# Patient Record
Sex: Male | Born: 1995
Health system: Southern US, Community
[De-identification: ages and names within clinical notes are randomized; demographics above are authoritative.]

## PROBLEM LIST (undated history)

## (undated) ENCOUNTER — Emergency Department (HOSPITAL_COMMUNITY): Payer: Medicaid Other

## (undated) ENCOUNTER — Emergency Department (HOSPITAL_COMMUNITY): Payer: Medicaid Other | Source: Home / Self Care

## (undated) DIAGNOSIS — J45909 Unspecified asthma, uncomplicated: Secondary | ICD-10-CM

## (undated) DIAGNOSIS — Z4802 Encounter for removal of sutures: Secondary | ICD-10-CM

## (undated) DIAGNOSIS — T148XXA Other injury of unspecified body region, initial encounter: Secondary | ICD-10-CM

## (undated) DIAGNOSIS — F1910 Other psychoactive substance abuse, uncomplicated: Secondary | ICD-10-CM

## (undated) DIAGNOSIS — T07XXXA Unspecified multiple injuries, initial encounter: Secondary | ICD-10-CM

## (undated) DIAGNOSIS — Z4800 Encounter for change or removal of nonsurgical wound dressing: Secondary | ICD-10-CM

## (undated) HISTORY — PX: TYMPANOSTOMY TUBE PLACEMENT: SHX32

---
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
## 2014-08-27 ENCOUNTER — Emergency Department (HOSPITAL_BASED_OUTPATIENT_CLINIC_OR_DEPARTMENT_OTHER)
Admission: EM | Admit: 2014-08-27 | Discharge: 2014-08-27 | Disposition: A | Discharge status: Home / Self Care | Payer: Self-pay | Attending: Emergency Medicine | Admitting: Emergency Medicine

## 2014-08-27 ENCOUNTER — Encounter (HOSPITAL_BASED_OUTPATIENT_CLINIC_OR_DEPARTMENT_OTHER): Payer: Self-pay | Admitting: Emergency Medicine

## 2014-08-27 ENCOUNTER — Emergency Department (HOSPITAL_BASED_OUTPATIENT_CLINIC_OR_DEPARTMENT_OTHER): Payer: BC Managed Care – PPO

## 2014-08-27 DIAGNOSIS — J45901 Unspecified asthma with (acute) exacerbation: Secondary | ICD-10-CM | POA: Insufficient documentation

## 2014-08-27 DIAGNOSIS — R05 Cough: Secondary | ICD-10-CM

## 2014-08-27 DIAGNOSIS — Z79899 Other long term (current) drug therapy: Secondary | ICD-10-CM | POA: Insufficient documentation

## 2014-08-27 DIAGNOSIS — T162XXA Foreign body in left ear, initial encounter: Secondary | ICD-10-CM | POA: Insufficient documentation

## 2014-08-27 DIAGNOSIS — Z720 Tobacco use: Secondary | ICD-10-CM | POA: Insufficient documentation

## 2014-08-27 DIAGNOSIS — X58XXXA Exposure to other specified factors, initial encounter: Secondary | ICD-10-CM | POA: Insufficient documentation

## 2014-08-27 DIAGNOSIS — Y9289 Other specified places as the place of occurrence of the external cause: Secondary | ICD-10-CM | POA: Insufficient documentation

## 2014-08-27 DIAGNOSIS — Y998 Other external cause status: Secondary | ICD-10-CM | POA: Insufficient documentation

## 2014-08-27 DIAGNOSIS — J069 Acute upper respiratory infection, unspecified: Secondary | ICD-10-CM | POA: Insufficient documentation

## 2014-08-27 DIAGNOSIS — R059 Cough, unspecified: Secondary | ICD-10-CM

## 2014-08-27 DIAGNOSIS — Y9389 Activity, other specified: Secondary | ICD-10-CM | POA: Insufficient documentation

## 2014-08-27 MED ORDER — AZITHROMYCIN 250 MG PO TABS
500.0000 mg | ORAL_TABLET | Freq: Once | ORAL | Status: AC
  Administered 2014-08-27: 500 mg via ORAL
  Filled 2014-08-27: qty 2

## 2014-08-27 MED ORDER — ALBUTEROL SULFATE HFA 108 (90 BASE) MCG/ACT IN AERS
2.0000 | INHALATION_SPRAY | Freq: Once | RESPIRATORY_TRACT | Status: AC
  Administered 2014-08-27: 2 via RESPIRATORY_TRACT
  Filled 2014-08-27: qty 6.7

## 2014-08-27 MED ORDER — AZITHROMYCIN 250 MG PO TABS: 250.0000 mg | ORAL_TABLET | Freq: Every day | ORAL | Status: DC

## 2014-08-27 MED ORDER — PREDNISONE 20 MG PO TABS: ORAL_TABLET | ORAL | Status: DC

## 2014-08-27 MED ORDER — PREDNISONE 50 MG PO TABS
60.0000 mg | ORAL_TABLET | Freq: Once | ORAL | Status: AC
  Administered 2014-08-27: 60 mg via ORAL
  Filled 2014-08-27 (×2): qty 1

## 2014-08-27 MED ORDER — ALBUTEROL SULFATE (2.5 MG/3ML) 0.083% IN NEBU
2.5000 mg | INHALATION_SOLUTION | Freq: Once | RESPIRATORY_TRACT | Status: AC
  Administered 2014-08-27: 2.5 mg via RESPIRATORY_TRACT
  Filled 2014-08-27: qty 3

## 2014-08-27 MED ORDER — IPRATROPIUM-ALBUTEROL 0.5-2.5 (3) MG/3ML IN SOLN
3.0000 mL | Freq: Once | RESPIRATORY_TRACT | Status: AC
  Administered 2014-08-27: 3 mL via RESPIRATORY_TRACT
  Filled 2014-08-27: qty 3

## 2014-08-27 NOTE — ED Notes (Addendum)
Pt having nasal congestion cough for several days.  Some sob with lying down and exertion.  No fever.  Pt c/o left ear pain with drainage.  Pt requesting a new inhaler.

## 2014-08-27 NOTE — Discharge Instructions (Signed)
Return to the emergency room for any worsening or concerning symptoms including fast breathing, heart racing, confusion, vomiting.  Rest, cover your mouth when you cough and wash your hands frequently.   Push fluids: water or Gatorade, do not drink any soda, juice or caffeinated beverages.  Take acetaminophen (Tylenol) up to 975 mg (this is normally 3 over-the-counter pills) up to 3 times a day. Do not drink alcohol. Make sure your other medications do not contain acetaminophen (Read the labels!)  Do not hesitate to return to the emergency room for any new, worsening or concerning symptoms.  Please obtain primary care using resource guide below. But the minute you were seen in the emergency room and that they will need to obtain records for further outpatient management.   Asthma Asthma is a condition of the lungs in which the airways tighten and narrow. Asthma can make it hard to breathe. Asthma cannot be cured, but medicine and lifestyle changes can help control it. Asthma may be started (triggered) by:  Animal skin flakes (dander).  Dust.  Cockroaches.  Pollen.  Mold.  Smoke.  Cleaning products.  Hair sprays or aerosol sprays.  Paint fumes or strong smells.  Cold air, weather changes, and winds.  Crying or laughing hard.  Stress.  Certain medicines or drugs.  Foods, such as dried fruit, potato chips, and sparkling grape juice.  Infections or conditions (colds, flu).  Exercise.  Certain medical conditions or diseases.  Exercise or tiring activities. HOME CARE   Take medicine as told by your doctor.  Use a peak flow meter as told by your doctor. A peak flow meter is a tool that measures how well the lungs are working.  Record and keep track of the peak flow meter's readings.  Understand and use the asthma action plan. An asthma action plan is a written plan for taking care of your asthma and treating your attacks.  To help prevent asthma attacks:  Do  not smoke. Stay away from secondhand smoke.  Change your heating and air conditioning filter often.  Limit your use of fireplaces and wood stoves.  Get rid of pests (such as roaches and mice) and their droppings.  Throw away plants if you see mold on them.  Clean your floors. Dust regularly. Use cleaning products that do not smell.  Have someone vacuum when you are not home. Use a vacuum cleaner with a HEPA filter if possible.  Replace carpet with wood, tile, or vinyl flooring. Carpet can trap animal skin flakes and dust.  Use allergy-proof pillows, mattress covers, and box spring covers.  Wash bed sheets and blankets every week in hot water and dry them in a dryer.  Use blankets that are made of polyester or cotton.  Clean bathrooms and kitchens with bleach. If possible, have someone repaint the walls in these rooms with mold-resistant paint. Keep out of the rooms that are being cleaned and painted.  Wash hands often. GET HELP IF:  You have make a whistling sound when breaking (wheeze), have shortness of breath, or have a cough even if taking medicine to prevent attacks.  The colored mucus you cough up (sputum) is thicker than usual.  The colored mucus you cough up changes from clear or white to yellow, green, gray, or bloody.  You have problems from the medicine you are taking such as:  A rash.  Itching.  Swelling.  Trouble breathing.  You need reliever medicines more than 2-3 times a week.  Your peak  flow measurement is still at 50-79% of your personal best after following the action plan for 1 hour.  You have a fever. GET HELP RIGHT AWAY IF:   You seem to be worse and are not responding to medicine during an asthma attack.  You are short of breath even at rest.  You get short of breath when doing very little activity.  You have trouble eating, drinking, or talking.  You have chest pain.  You have a fast heartbeat.  Your lips or fingernails start to  turn blue.  You are light-headed, dizzy, or faint.  Your peak flow is less than 50% of your personal best. MAKE SURE YOU:   Understand these instructions.  Will watch your condition.  Will get help right away if you are not doing well or get worse. Document Released: 11/24/2007 Document Revised: 10/22/2013 Document Reviewed: 01/04/2013 [facility] Patient Information 2015 [place] [place]. This information is not intended to replace advice given to you by your health care provider. Make sure you discuss any questions you have with your health care provider.   Emergency Department Resource Guide 1) Find a Doctor and Pay Out of Pocket Although you won't have to find out who is covered by your insurance plan, it is a good idea to ask around and get recommendations. You will then need to call the office and see if the doctor you have chosen will accept you as a new patient and what types of options they offer for patients who are self-pay. Some doctors offer discounts or will set up payment plans for their patients who do not have insurance, but you will need to ask so you aren't surprised when you get to your appointment.  2) Contact Your Local Health Department Not all health departments have doctors that can see patients for sick visits, but many do, so it is worth a call to see if yours does. If you don't know where your local health department is, you can check in your phone book. The CDC also has a tool to help you locate your state's health department, and many state websites also have listings of all of their local health departments.  3) Find a Walk-in Clinic If your illness is not likely to be very severe or complicated, you may want to try a walk in clinic. These are popping up all over the country in pharmacies, drugstores, and shopping centers. They're usually staffed by nurse practitioners or physician assistants that have been trained to treat common illnesses and complaints. They're  usually fairly quick and inexpensive. However, if you have serious medical issues or chronic medical problems, these are probably not your best option.  No Primary Care Doctor: - Call Health Connect at  [phone] - they can help you locate a primary care doctor that  accepts your insurance, provides certain services, etc. - Physician Referral Service- [phone]  Chronic Pain Problems: Organization         Address  Phone   Notes  [facility]  [phone] Patients need to be referred by their primary care doctor.   Medication Assistance: Organization         Address  Phone   Notes  [facility] [address] [place], [place] [phone] --Must be a resident of [facility] [place] -- Must have NO insurance coverage whatsoever (no Medicaid/ Medicare, etc.) -- The pt. MUST have a primary care doctor that directs their care regularly  and follows them in the community   [facility]  [phone]   [employer]  [phone]    Agencies that provide inexpensive medical care: Organization         Address  Phone   Notes  [facility]  [phone]   [facility]    [phone]   [facility] [address] [place], [place] [address] [phone]   [facility] [address] [place], [place] [phone]   Planned Parenthood    [phone]   [facility]    [phone]   [facility]  [address], [place] Phone:  [phone], Fax:  [phone] Hours of Operation:  9 am - 6 pm, M-F.  Also accepts Medicaid/Medicare and self-pay.  [facility]  [address], [place] Phone: [phone], Fax: [phone]. Hours of Operation:  8:30 am - 5:30 pm, M-F.  Also accepts Medicaid and self-pay.  [facility] [place] [address] [place] Phone:  [phone]   [facility] [address] Natasha Bence [place], [place] [phone], Ext. [phone] Mondays & Thursdays: 7-9 AM.  First 15 patients are seen on a first come, first serve basis.    Medicaid-accepting [facility] Providers:  Organization         Address  Phone   Notes  [facility] [address], [place] [phone] Also accepts self-pay patients.  [facility] [address] Laurell Josephs [place], [place]  [phone]   [facility] [address], [place] [phone]   [facility] [address], [place] [phone]   Renaye Rakers [address], [place]   [phone] Only accepts [place] patients after they have their name applied to their card.   Self-Pay (no insurance) in [facility]:  Organization         Address  Phone   Notes  Sickle Cell Patients, [facility] Medicine [address] [place], [place] [phone]   [facility] [address] [place], [place] [phone]   [facility]  [address], [place] [phone]   [facility]/Dr. Osei-Bonsu  [address] [place] or [address], [place] [phone] Phone number for both [place] and [place] locations is the same.  [facility] [address] [place] [phone]   [facility] [address], [place] or [address] [phone]   [facility] [address] [place] [phone], phone; [phone], fax Sees patients 1st and 3rd Saturday of every month.  Must not qualify for public or private insurance (i.e. Medicaid, Medicare, [place] Health Choice, Veterans' Benefits)  Household income should be no more than 200% of the poverty level The clinic cannot treat  you if you are pregnant or think you are pregnant  Sexually transmitted diseases are not treated at the clinic.    Dental Care: Organization         Address  Phone  Notes  [facility] Department of [facility] [address] [place], [place] [phone] Accepts children up to age 64 who  are enrolled in Medicaid or [place] Health Choice; pregnant women with a Medicaid card; and children who have applied for Medicaid or [place] Health Choice, but were declined, whose parents can pay a reduced fee at time of service.  [facility] of [facility]  [address], [place] [phone] Accepts children up to age 44 who are enrolled in [place] or [place] Health Choice; pregnant women with a Medicaid card; and children who have applied for Medicaid or [place] Health Choice, but were declined, whose parents can pay a reduced fee at time of service.  [facility]  [address] [place], [place] [phone] Patients are seen by appointment only. Walk-ins are not accepted. [facility] will see patients 61 years of age and older. Monday - Tuesday (8am-5pm) Most Wednesdays (8:30-5pm) $30 per visit, cash only  [facility] Access [facility]  [address], [facility] [phone] Patients are seen by appointment only. Walk-ins are not accepted. [facility] will see patients 74 years of age and older. One Wednesday Evening (Monthly: Volunteer Based).  $30 per visit, cash only  [employer]  [phone] for adults; Children under age 87, call Graduate Pediatric Dentistry at [phone]. Children aged 59-14, please call [phone] to request a pediatric application.  Dental services are provided in all areas of dental care including fillings, crowns and bridges, complete and partial dentures, implants, gum treatment, root canals, and extractions. Preventive care is also provided. Treatment  is provided to both adults and children. Patients are selected via a lottery and there is often a waiting list.   [facility] [address] [place]  [phone] [email or web address]   [facility] [address] [place], [place] [phone], Ext. [phone] Second and Fourth Thursday of each month, opens at 6:30 AM; Clinic ends at 9 AM.  Patients are seen on a first-come first-served basis, and a limited number are seen during each clinic.   [facility]  [address] Ether Griffins [place], [place] [phone]   Eligibility Requirements You must have lived in [place], [place], or [place] counties for at least the last three months.   You cannot be eligible for state or federal sponsored [place], including [employer], [place], or [employer].   You generally cannot be eligible for healthcare insurance through your employer.    How to apply: Eligibility screenings are held every Tuesday and Wednesday afternoon from 1:00 pm until 4:00 pm. You do not need an appointment for the interview!  [facility] [address] [place], [place] [phone]   [facility]  [phone]   [facility] Health Department  [phone]   [facility] Health Department  [phone]    Behavioral Health Resources in the Community: Intensive Outpatient Programs Organization         Address  Phone  Notes  [facility] Services [address], [place], [place] [phone]   [facility] [address], [place], [place] [phone]   [facility] [address] [place], [place]  [phone]   [facility] Mental Health [address],  [place], [place] [phone] or [phone]   Substance Abuse Resources Organization         Address  Phone  Notes  [facility]  [phone]   [facility]  [phone]  [facility]  [facility]  [phone]   Floydene Flock  [phone]   Residential & Outpatient Substance Abuse Program  [phone]   Psychological Services Organization         Address  Phone  Notes  [facility] Behavioral Health  [phone]   [facility] Services  [phone]   [facility] Mental Health [address], [place] [phone] or [phone]    Mobile Crisis Teams Organization         Address  Phone  Notes  [facility], Mobile Crisis Care Unit  [phone]   [facility]  [address] [place], [place] [phone]   Doristine Locks [address] [place] [phone]    Self-Help/Support Groups Organization         Address  Phone             Notes  [facility] [place] - variety of support groups  336- I7437963 Call for more information  Narcotics Anonymous (NA), [facility] [address], [employer] [place]  2 meetings at this location   Statistician         Address  Phone  Notes  [facility] [address],    [place]  [phone]   [facility]  [address], [place] [address], [place], [place] [phone]   [facility] [address] [place], [place] [phone] Admissions: 8am-3pm M-F  [facility] [address],    [place], [place] [phone]   [facility] [address] [place], [place], [place] [phone]   [facility] [address]  [place], [place] [phone]   [facility] - Intensive Outpatient [address] Laurell Josephs [address], [place], [place] [phone]   [facility] ([facility]iction [facility] [address] [place]  [place], [place] [phone] or [phone]   [facility] ([facility]) [address], [place], [place] [phone] Accepts Medicaid  [facility] [address],  [place] [phone] Substance Abuse/Addiction Treatment     [facility] Organization         Address  Phone  Notes  [facility]  [phone]   Angie Fava, PhD [address] Ervin Knack [place], [place]   [phone] or [phone]   [facility] Behavioral   [address] [place], [place] [phone]   [facility] [address] [place], [place] [phone] Insurance/Medicaid/sponsorship through [facility] [address]                                    [place], [place] [phone] Therapy/tele-psych/case  [facility] [address], [place] [phone]    Dr. Lolly Mustache  [phone]   [facility]  [employer] [facility] Dept. 1) [address] [place] 2) [address], [place] 3)  [address], [place] [phone]  [phone]   [facility] Child Abuse Hotline 734-268-6244 or [phone] (After Hours)
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
## 2014-08-27 NOTE — ED Notes (Signed)
Foreign material removed from left ear using suction extractor pt reports relief and pain now 0/10 hearing has greatly improved.

## 2014-12-20 ENCOUNTER — Emergency Department (HOSPITAL_BASED_OUTPATIENT_CLINIC_OR_DEPARTMENT_OTHER): Payer: BC Managed Care – PPO

## 2014-12-20 ENCOUNTER — Emergency Department (HOSPITAL_BASED_OUTPATIENT_CLINIC_OR_DEPARTMENT_OTHER)
Admission: EM | Admit: 2014-12-20 | Discharge: 2014-12-20 | Disposition: A | Discharge status: Home / Self Care | Payer: BC Managed Care – PPO | Attending: Emergency Medicine | Admitting: Emergency Medicine

## 2014-12-20 ENCOUNTER — Encounter (HOSPITAL_BASED_OUTPATIENT_CLINIC_OR_DEPARTMENT_OTHER): Payer: Self-pay

## 2014-12-20 DIAGNOSIS — Z87828 Personal history of other (healed) physical injury and trauma: Secondary | ICD-10-CM | POA: Insufficient documentation

## 2014-12-20 DIAGNOSIS — J45901 Unspecified asthma with (acute) exacerbation: Secondary | ICD-10-CM | POA: Insufficient documentation

## 2014-12-20 DIAGNOSIS — Z792 Long term (current) use of antibiotics: Secondary | ICD-10-CM | POA: Insufficient documentation

## 2014-12-20 DIAGNOSIS — Z720 Tobacco use: Secondary | ICD-10-CM | POA: Insufficient documentation

## 2014-12-20 MED ORDER — PREDNISONE 10 MG PO TABS
60.0000 mg | ORAL_TABLET | Freq: Once | ORAL | Status: AC
  Administered 2014-12-20: 60 mg via ORAL
  Filled 2014-12-20 (×2): qty 1

## 2014-12-20 MED ORDER — ALBUTEROL SULFATE (2.5 MG/3ML) 0.083% IN NEBU
INHALATION_SOLUTION | RESPIRATORY_TRACT | Status: AC
  Administered 2014-12-20: 2.5 mg
  Filled 2014-12-20: qty 3

## 2014-12-20 MED ORDER — PREDNISONE 20 MG PO TABS: ORAL_TABLET | ORAL | Status: DC

## 2014-12-20 MED ORDER — IPRATROPIUM-ALBUTEROL 0.5-2.5 (3) MG/3ML IN SOLN
RESPIRATORY_TRACT | Status: AC
  Administered 2014-12-20: 3 mL
  Filled 2014-12-20: qty 3

## 2014-12-20 MED ORDER — ALBUTEROL SULFATE HFA 108 (90 BASE) MCG/ACT IN AERS
2.0000 | INHALATION_SPRAY | RESPIRATORY_TRACT | Status: DC | PRN
Start: 2014-12-20 — End: 2014-12-20
  Administered 2014-12-20: 2 via RESPIRATORY_TRACT
  Filled 2014-12-20: qty 6.7

## 2014-12-20 MED ORDER — ALBUTEROL SULFATE HFA 108 (90 BASE) MCG/ACT IN AERS: 1.0000 | INHALATION_SPRAY | Freq: Four times a day (QID) | RESPIRATORY_TRACT | Status: DC | PRN
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
## 2014-12-20 NOTE — ED Notes (Addendum)
Pt reports he is experiencing an asthma exacerbation r/t being out of his albuterol inhaler - pt c/o shortness of breath, wheezing, cough, intermittent x1 week. Reports being exposed to lots of dust at work.

## 2014-12-20 NOTE — Discharge Instructions (Signed)

## 2016-11-19 ENCOUNTER — Emergency Department (HOSPITAL_COMMUNITY): Payer: Self-pay

## 2016-11-19 ENCOUNTER — Encounter (HOSPITAL_COMMUNITY): Payer: Self-pay

## 2016-11-19 ENCOUNTER — Emergency Department (HOSPITAL_COMMUNITY)
Admission: EM | Admit: 2016-11-19 | Discharge: 2016-11-19 | Disposition: A | Discharge status: Home / Self Care | Payer: Self-pay | Attending: Emergency Medicine | Admitting: Emergency Medicine

## 2016-11-19 DIAGNOSIS — Y998 Other external cause status: Secondary | ICD-10-CM | POA: Insufficient documentation

## 2016-11-19 DIAGNOSIS — J45909 Unspecified asthma, uncomplicated: Secondary | ICD-10-CM | POA: Insufficient documentation

## 2016-11-19 DIAGNOSIS — S43015A Anterior dislocation of left humerus, initial encounter: Secondary | ICD-10-CM | POA: Insufficient documentation

## 2016-11-19 DIAGNOSIS — S43005A Unspecified dislocation of left shoulder joint, initial encounter: Secondary | ICD-10-CM

## 2016-11-19 DIAGNOSIS — Y939 Activity, unspecified: Secondary | ICD-10-CM | POA: Insufficient documentation

## 2016-11-19 DIAGNOSIS — F17210 Nicotine dependence, cigarettes, uncomplicated: Secondary | ICD-10-CM | POA: Insufficient documentation

## 2016-11-19 DIAGNOSIS — X58XXXA Exposure to other specified factors, initial encounter: Secondary | ICD-10-CM | POA: Insufficient documentation

## 2016-11-19 DIAGNOSIS — Y929 Unspecified place or not applicable: Secondary | ICD-10-CM | POA: Insufficient documentation

## 2016-11-19 DIAGNOSIS — M21822 Other specified acquired deformities of left upper arm: Secondary | ICD-10-CM

## 2016-11-19 MED ORDER — OXYCODONE-ACETAMINOPHEN 5-325 MG PO TABS: 1.0000 | ORAL_TABLET | ORAL | 0 refills | Status: DC | PRN

## 2016-11-19 MED ORDER — HYDROMORPHONE HCL 1 MG/ML IJ SOLN
1.0000 mg | Freq: Once | INTRAMUSCULAR | Status: AC
  Administered 2016-11-19: 1 mg via INTRAVENOUS
  Filled 2016-11-19: qty 1

## 2016-11-19 MED ORDER — HYDROCODONE-ACETAMINOPHEN 5-325 MG PO TABS
2.0000 | ORAL_TABLET | Freq: Once | ORAL | Status: AC
  Administered 2016-11-19: 2 via ORAL
  Filled 2016-11-19: qty 2

## 2016-11-19 MED ORDER — BUPIVACAINE HCL (PF) 0.5 % IJ SOLN
10.0000 mL | Freq: Once | INTRAMUSCULAR | Status: AC
  Administered 2016-11-19: 10 mL
  Filled 2016-11-19: qty 10

## 2016-11-19 MED ORDER — LIDOCAINE HCL (PF) 1 % IJ SOLN
10.0000 mL | Freq: Once | INTRAMUSCULAR | Status: AC
  Administered 2016-11-19: 5 mL
  Filled 2016-11-19: qty 10

## 2016-11-19 MED ORDER — FENTANYL CITRATE (PF) 100 MCG/2ML IJ SOLN: 50.0000 ug | Freq: Once | INTRAMUSCULAR | Status: DC

## 2016-11-19 NOTE — ED Notes (Signed)
Pt states he understands instructionbs. Taking po fluids and tolerating well.Home stable with steady gait.
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
## 2016-11-19 NOTE — ED Notes (Signed)
Patient transported to X-ray 

## 2016-11-19 NOTE — ED Notes (Signed)
Shoulder immobilizer applied with positive left radial pulse after application. Good distal cap refill noted.

## 2016-11-19 NOTE — Discharge Instructions (Signed)
IT IS VERY IMPORTANT THAT YOU FOLLOW-UP WITH DR. DEAN OR AN ORTHOPEDIST IN THE NEXT WEEK  WEAR YOUR SLING AT ALL TIMES, DO NOT USE THE LEFT ARM FOR ANY REASON  DUE TO YOUR INJURY, IT IS LIKELY THAT YOU HAVE LIGAMENT INJURIES AS WELL AS A SMALL FRACTURE (HILL-SACHS FRACTURE, DUE TO THE DISLOCATION); IT IS VERY IMPORTANT TO FOLLOW-UP WITH A SURGEON TO HELP PREVENT LONG TERM PAIN AND DISABILITY

## 2016-11-19 NOTE — ED Triage Notes (Signed)
Pt presents for evaluation of L shoulder pain/possible dislocation. Reports woke up with pain this AM. Denies hx of injury to L shoulder or trauma. Denies sx to L shoulder.

## 2016-11-19 NOTE — ED Notes (Addendum)
Patient transported to X-ray 

## 2016-11-19 NOTE — ED Notes (Signed)
Right and left radial pulse positive and equal.

## 2016-11-19 NOTE — ED Notes (Signed)
ED Provider at bedside. 

## 2017-07-27 ENCOUNTER — Emergency Department (HOSPITAL_BASED_OUTPATIENT_CLINIC_OR_DEPARTMENT_OTHER): Payer: BC Managed Care – PPO

## 2017-07-27 ENCOUNTER — Other Ambulatory Visit: Payer: Self-pay

## 2017-07-27 ENCOUNTER — Encounter (HOSPITAL_BASED_OUTPATIENT_CLINIC_OR_DEPARTMENT_OTHER): Payer: Self-pay | Admitting: *Deleted

## 2017-07-27 ENCOUNTER — Emergency Department (HOSPITAL_BASED_OUTPATIENT_CLINIC_OR_DEPARTMENT_OTHER)
Admission: EM | Admit: 2017-07-27 | Discharge: 2017-07-27 | Disposition: A | Discharge status: Home / Self Care | Payer: BC Managed Care – PPO | Attending: Physician Assistant | Admitting: Physician Assistant

## 2017-07-27 DIAGNOSIS — Y990 Civilian activity done for income or pay: Secondary | ICD-10-CM | POA: Insufficient documentation

## 2017-07-27 DIAGNOSIS — S8392XA Sprain of unspecified site of left knee, initial encounter: Secondary | ICD-10-CM

## 2017-07-27 DIAGNOSIS — Z79899 Other long term (current) drug therapy: Secondary | ICD-10-CM | POA: Insufficient documentation

## 2017-07-27 DIAGNOSIS — Y929 Unspecified place or not applicable: Secondary | ICD-10-CM | POA: Insufficient documentation

## 2017-07-27 DIAGNOSIS — F17210 Nicotine dependence, cigarettes, uncomplicated: Secondary | ICD-10-CM | POA: Insufficient documentation

## 2017-07-27 DIAGNOSIS — X509XXA Other and unspecified overexertion or strenuous movements or postures, initial encounter: Secondary | ICD-10-CM | POA: Insufficient documentation

## 2017-07-27 DIAGNOSIS — Y939 Activity, unspecified: Secondary | ICD-10-CM | POA: Insufficient documentation

## 2017-07-27 DIAGNOSIS — J45909 Unspecified asthma, uncomplicated: Secondary | ICD-10-CM | POA: Insufficient documentation

## 2017-07-27 MED ORDER — NAPROXEN 500 MG PO TABS: 500.0000 mg | ORAL_TABLET | Freq: Two times a day (BID) | ORAL | 0 refills | Status: DC

## 2017-07-27 MED FILL — NAPROXEN 500 MG TABLET: 500 | 10 days supply | Qty: 20 | Fill #0

## 2017-07-27 NOTE — ED Notes (Signed)
Pt verbalizes understanding of d/c instructions and denies any further need at this time. 

## 2017-07-27 NOTE — Discharge Instructions (Signed)
Please read and follow all provided instructions.  Your diagnoses today include:  1. Sprain of left knee, unspecified ligament, initial encounter     Tests performed today include:  An x-ray of the affected area - does NOT show any broken bones  Vital signs. See below for your results today.   Medications prescribed:   Naproxen - anti-inflammatory pain medication  Do not exceed 500mg  naproxen every 12 hours, take with food  You have been prescribed an anti-inflammatory medication or NSAID. Take with food. Take smallest effective dose for the shortest duration needed for your pain. Stop taking if you experience stomach pain or vomiting.   Take any prescribed medications only as directed.  Home care instructions:   Follow any educational materials contained in this packet  Follow R.I.C.E. Protocol:  R - rest your injury   I  - use ice on injury without applying directly to skin  C - compress injury with bandage or splint  E - elevate the injury as much as possible  Follow-up instructions: Please follow-up with your primary care provider or the provided orthopedic physician (bone specialist) if you continue to have significant pain in 1 week. In this case you may have a more severe injury that requires further care.   Return instructions:   Please return if your toes or feet are numb or tingling, appear gray or blue, or you have severe pain (also elevate the leg and loosen splint or wrap if you were given one)  Please return to the Emergency Department if you experience worsening symptoms.   Please return if you have any other emergent concerns.  Additional Information:  Your vital signs today were: BP 134/84 (BP Location: Right Arm)    Pulse 88    Temp 98.7 F (37.1 C) (Oral)    Resp 20    Ht 6' (1.829 m)    Wt 90.7 kg (200 lb)    SpO2 100%    BMI 27.12 kg/m  If your blood pressure (BP) was elevated above 135/85 this visit, please have this repeated by your doctor  within one month. --------------
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
## 2017-07-27 NOTE — ED Triage Notes (Signed)
Pt c/o left knee injury x 5 days ago

## 2017-12-26 ENCOUNTER — Emergency Department (HOSPITAL_COMMUNITY): Payer: Self-pay

## 2017-12-26 ENCOUNTER — Encounter (HOSPITAL_COMMUNITY): Payer: Self-pay

## 2017-12-26 ENCOUNTER — Emergency Department (HOSPITAL_COMMUNITY)
Admission: EM | Admit: 2017-12-26 | Discharge: 2017-12-27 | Disposition: A | Discharge status: Home / Self Care | Payer: Self-pay | Attending: Emergency Medicine | Admitting: Emergency Medicine

## 2017-12-26 ENCOUNTER — Other Ambulatory Visit: Payer: Self-pay

## 2017-12-26 DIAGNOSIS — Y929 Unspecified place or not applicable: Secondary | ICD-10-CM | POA: Insufficient documentation

## 2017-12-26 DIAGNOSIS — Y9383 Activity, rough housing and horseplay: Secondary | ICD-10-CM | POA: Insufficient documentation

## 2017-12-26 DIAGNOSIS — F17210 Nicotine dependence, cigarettes, uncomplicated: Secondary | ICD-10-CM | POA: Insufficient documentation

## 2017-12-26 DIAGNOSIS — Y999 Unspecified external cause status: Secondary | ICD-10-CM | POA: Insufficient documentation

## 2017-12-26 DIAGNOSIS — X58XXXA Exposure to other specified factors, initial encounter: Secondary | ICD-10-CM | POA: Insufficient documentation

## 2017-12-26 DIAGNOSIS — R52 Pain, unspecified: Secondary | ICD-10-CM

## 2017-12-26 DIAGNOSIS — S43015A Anterior dislocation of left humerus, initial encounter: Secondary | ICD-10-CM | POA: Insufficient documentation

## 2017-12-26 DIAGNOSIS — J45909 Unspecified asthma, uncomplicated: Secondary | ICD-10-CM | POA: Insufficient documentation

## 2017-12-26 MED ORDER — HYDROMORPHONE HCL 1 MG/ML IJ SOLN: 1.0000 mg | Freq: Once | INTRAMUSCULAR | Status: DC

## 2017-12-26 MED ORDER — ONDANSETRON HCL 4 MG/2ML IJ SOLN
4.0000 mg | Freq: Once | INTRAMUSCULAR | Status: AC
  Administered 2017-12-26: 4 mg via INTRAVENOUS
  Filled 2017-12-26: qty 2

## 2017-12-26 MED ORDER — HYDROMORPHONE HCL 1 MG/ML IJ SOLN
1.0000 mg | Freq: Once | INTRAMUSCULAR | Status: AC
  Administered 2017-12-26: 1 mg via INTRAVENOUS
  Filled 2017-12-26: qty 1

## 2017-12-26 MED ORDER — HYDROMORPHONE HCL 1 MG/ML IJ SOLN
0.5000 mg | Freq: Once | INTRAMUSCULAR | Status: AC
  Administered 2017-12-26: 0.5 mg via INTRAVENOUS
  Filled 2017-12-26: qty 1

## 2017-12-26 MED ORDER — PROPOFOL 10 MG/ML IV BOLUS
INTRAVENOUS | Status: AC | PRN
  Administered 2017-12-26: 3 mg via INTRAVENOUS
  Administered 2017-12-26 (×2): 4 mg via INTRAVENOUS

## 2017-12-26 MED ORDER — PROPOFOL 10 MG/ML IV BOLUS
1.0000 mg/kg | Freq: Once | INTRAVENOUS | Status: AC
  Administered 2017-12-26: 90 mg via INTRAVENOUS
  Filled 2017-12-26: qty 20

## 2017-12-26 MED ORDER — FENTANYL CITRATE (PF) 100 MCG/2ML IJ SOLN
100.0000 ug | Freq: Once | INTRAMUSCULAR | Status: AC
  Administered 2017-12-26: 100 ug via INTRAMUSCULAR
  Filled 2017-12-26: qty 2
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
## 2017-12-26 NOTE — ED Provider Notes (Signed)
Patient placed in Quick Look pathway, seen and evaluated   Chief Complaint: Left shoulder pain  HPI:   Patient presents with acute onset of left shoulder pain.  Pain is constant.  Pain occurred after "messing around" with a friend.  States it feels like the last time he dislocated his shoulder but much worse.  States he is unable to move the shoulder.  He is able to wiggle his fingers and notes mild numbness and tingling of his fingers.  ROS: Positive for shoulder pain, numbness  negative for weakness, syncope  Physical Exam:   Gen: No distress  Neuro: Awake and Alert  Skin: Warm    Focused Exam: 2+ radial pulses bilaterally.  Good grip strength bilaterally.  Patient has obvious deformity to the left shoulder, unable to range the shoulder passively or actively.   Initiation of care has begun. The patient has been counseled on the process, plan, and necessity for staying for the completion/evaluation, and the remainder of the medical screening examination    Bennye AlmFawze, Micaiah Remillard A, PA-C 12/26/17 Conley Simmonds1937    Long, Joshua G, MD 12/27/17 1100

## 2017-12-26 NOTE — ED Notes (Signed)
Pt's left arm in sling

## 2017-12-26 NOTE — Progress Notes (Signed)
RRT at bedside for conscious sedation for left shoulder dislocation. Pt was stable throughout procedure. No apparent complications noted. N respiratory compromise noted.

## 2017-12-27 MED ORDER — NAPROXEN 500 MG PO TABS: 500.0000 mg | ORAL_TABLET | Freq: Two times a day (BID) | ORAL | 0 refills | Status: DC

## 2017-12-27 NOTE — ED Provider Notes (Signed)
Post reduction x-rays not formally read by radiology yet but on my review the shoulder is reduced.   [name], Duane Cragle, MD 12/27/17 [phone]

## 2017-12-27 NOTE — ED Provider Notes (Signed)
Medical screening examination/treatment/procedure(s) were conducted as a shared visit with non-physician practitioner(s) and myself.  I personally evaluated the patient during the encounter.  None   Patient seen by me along with the physician assistant.  Patient presents with probable dislocation of his left shoulder confirmed by x-ray.  Patient states that it is come out several times in the past approximately 5 times before.  He was messing around with a friend and it popped out.  He tried to get it himself but he could not get it in.  On exam patient had obvious left shoulder deformity a good radial pulse had good movement of his fingers sensation to the proximal arm and hand was normal.  Patient denied any other injuries.  Heart was regular lungs were clear.  Patient underwent conscious sedation with propofol he received 16 mg of propofol got relaxed enough physician assistant reduced the shoulder confirmed that it was in clinically x-rays are pending.  Patient put in a shoulder immobilizer.  Patient tolerated the conscious sedation and procedure fine no significant problems   Patient was told important that he follow-up with orthopedics because this will come out again.   [name], Linsie Lupo, MD 12/27/17 0005

## 2017-12-27 NOTE — ED Notes (Signed)
Patient transported to X-ray 

## 2017-12-27 NOTE — ED Notes (Signed)
Pt verbalizes understanding of d/c instructions. Pt received prescriptions. Pt ambulatory at d/c with all belongings.  

## 2017-12-27 NOTE — Discharge Instructions (Addendum)
Keep your arm in a shoulder immobilizer for the next 48 to 72 hours.  We recommend follow-up with orthopedics.  Avoid strenuous activity and heavy lifting.  Take naproxen for pain and management of inflammation.  You may apply ice to your shoulder 3-4 times per day for 15 to 20 minutes each time to assist in managing inflammation.  Return for new or concerning symptoms.

## 2018-02-23 ENCOUNTER — Other Ambulatory Visit: Payer: Self-pay

## 2018-02-23 ENCOUNTER — Encounter (HOSPITAL_BASED_OUTPATIENT_CLINIC_OR_DEPARTMENT_OTHER): Payer: Self-pay | Admitting: *Deleted

## 2018-02-23 DIAGNOSIS — W260XXA Contact with knife, initial encounter: Secondary | ICD-10-CM | POA: Insufficient documentation

## 2018-02-23 DIAGNOSIS — Y998 Other external cause status: Secondary | ICD-10-CM | POA: Insufficient documentation

## 2018-02-23 DIAGNOSIS — F17210 Nicotine dependence, cigarettes, uncomplicated: Secondary | ICD-10-CM | POA: Insufficient documentation

## 2018-02-23 DIAGNOSIS — S61213A Laceration without foreign body of left middle finger without damage to nail, initial encounter: Secondary | ICD-10-CM | POA: Insufficient documentation

## 2018-02-23 DIAGNOSIS — J45909 Unspecified asthma, uncomplicated: Secondary | ICD-10-CM | POA: Insufficient documentation

## 2018-02-23 DIAGNOSIS — Y939 Activity, unspecified: Secondary | ICD-10-CM | POA: Insufficient documentation

## 2018-02-23 DIAGNOSIS — Y929 Unspecified place or not applicable: Secondary | ICD-10-CM | POA: Insufficient documentation

## 2018-02-23 NOTE — ED Triage Notes (Signed)
Laceration to his left middle finger with a box cutter while at work today. States this is not workmans comp.

## 2018-02-24 ENCOUNTER — Emergency Department (HOSPITAL_BASED_OUTPATIENT_CLINIC_OR_DEPARTMENT_OTHER)
Admission: EM | Admit: 2018-02-24 | Discharge: 2018-02-24 | Disposition: A | Discharge status: Home / Self Care | Payer: Self-pay | Attending: Emergency Medicine | Admitting: Emergency Medicine

## 2018-02-24 DIAGNOSIS — S61213A Laceration without foreign body of left middle finger without damage to nail, initial encounter: Secondary | ICD-10-CM

## 2018-02-24 MED ORDER — BACITRACIN ZINC 500 UNIT/GM EX OINT
TOPICAL_OINTMENT | Freq: Two times a day (BID) | CUTANEOUS | Status: DC
  Administered 2018-02-24: 04:00:00 via TOPICAL
  Filled 2018-02-24: qty 28.35

## 2018-02-24 MED ORDER — CEPHALEXIN 500 MG PO CAPS: 500.0000 mg | ORAL_CAPSULE | Freq: Two times a day (BID) | ORAL | 0 refills | Status: DC

## 2018-02-24 MED ORDER — CEPHALEXIN 250 MG PO CAPS
500.0000 mg | ORAL_CAPSULE | Freq: Once | ORAL | Status: AC
  Administered 2018-02-24: 500 mg via ORAL
  Filled 2018-02-24: qty 2

## 2018-02-24 MED ORDER — LIDOCAINE HCL 2 % IJ SOLN
10.0000 mL | Freq: Once | INTRAMUSCULAR | Status: AC
  Administered 2018-02-24: 200 mg
  Filled 2018-02-24: qty 20

## 2018-02-24 NOTE — Discharge Instructions (Addendum)
The skin of your finger was almost completely cut off.  Although it has been stitched in place, there is a chance that the skin will not survive.  If that happens, the skin will turn pale and fall off.  It will be up to the hand specialist whether to allow new skin to grow in slowly or apply a skin graft.  Keep the splint in place until you see the hand specialist.  Take ibuprofen, naproxen, or acetaminophen as needed for pain.
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
## 2018-02-26 ENCOUNTER — Emergency Department (HOSPITAL_COMMUNITY)
Admission: EM | Admit: 2018-02-26 | Discharge: 2018-02-27 | Disposition: A | Discharge status: Home / Self Care | Payer: Self-pay | Attending: Emergency Medicine | Admitting: Emergency Medicine

## 2018-02-26 ENCOUNTER — Other Ambulatory Visit: Payer: Self-pay

## 2018-02-26 ENCOUNTER — Encounter (HOSPITAL_COMMUNITY): Payer: Self-pay

## 2018-02-26 DIAGNOSIS — Z09 Encounter for follow-up examination after completed treatment for conditions other than malignant neoplasm: Secondary | ICD-10-CM | POA: Insufficient documentation

## 2018-02-26 DIAGNOSIS — Z5189 Encounter for other specified aftercare: Secondary | ICD-10-CM

## 2018-02-26 NOTE — ED Triage Notes (Signed)
Pt reports L middle finger pain. He reports that he got stiches on Thursday for this cut and he thinks it may be infected. Purulent discharge noted. A&Ox4.

## 2018-02-27 NOTE — Discharge Instructions (Addendum)
The sutures should be removed in about 9-10 days.  It appears you had a almost complete a avulsion of that skin flap.  Right now it appears that the skin flap is struggling, more than likely the flap does not have a good blood supply.  The flap may go ahead and lose its circulation and die in which case it will get dried and will eventually come off.  However as long as it is in place there is still a chance it could develop some blood supply from underneath.  In which case the flap will survive.  Return if you get increased swelling, pain, or the wound starts draining pus.
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
## 2018-03-29 ENCOUNTER — Ambulatory Visit (HOSPITAL_COMMUNITY)
Admission: EM | Admit: 2018-03-29 | Discharge: 2018-03-29 | Disposition: A | Discharge status: Home / Self Care | Payer: Self-pay | Attending: Family Medicine | Admitting: Family Medicine

## 2018-03-29 ENCOUNTER — Encounter (HOSPITAL_COMMUNITY): Payer: Self-pay | Admitting: Emergency Medicine

## 2018-03-29 ENCOUNTER — Other Ambulatory Visit: Payer: Self-pay

## 2018-03-29 DIAGNOSIS — H66003 Acute suppurative otitis media without spontaneous rupture of ear drum, bilateral: Secondary | ICD-10-CM

## 2018-03-29 MED ORDER — AMOXICILLIN-POT CLAVULANATE 875-125 MG PO TABS: 1.0000 | ORAL_TABLET | Freq: Two times a day (BID) | ORAL | 0 refills | Status: AC

## 2018-03-29 NOTE — Discharge Instructions (Signed)
Begin augmentin twice daily for 10 days Tylenol and Ibuprofen as needed for pain May try flonase to help with fluid in ears  Follow up if symptoms not improving or worsening

## 2018-03-29 NOTE — ED Triage Notes (Signed)
Pt reports left ear pain and drainage x2 months.  He began having the same problem in the right ear 2 days ago.
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
## 2018-06-16 MED FILL — DICLOFENAC SOD EC 50 MG TAB: 50 | 14 days supply | Qty: 28 | Fill #0

## 2018-06-16 MED FILL — traMADol HCL 50 MG TABS: 50 | 14 days supply | Qty: 14 | Fill #0

## 2018-06-16 MED FILL — predniSONE 10 MG TABS: 10 | 6 days supply | Qty: 21 | Fill #0

## 2019-01-30 ENCOUNTER — Emergency Department (HOSPITAL_BASED_OUTPATIENT_CLINIC_OR_DEPARTMENT_OTHER)
Admission: EM | Admit: 2019-01-30 | Discharge: 2019-01-31 | Disposition: A | Discharge status: Home / Self Care | Payer: Self-pay | Attending: Emergency Medicine | Admitting: Emergency Medicine

## 2019-01-30 ENCOUNTER — Other Ambulatory Visit: Payer: Self-pay

## 2019-01-30 ENCOUNTER — Encounter (HOSPITAL_BASED_OUTPATIENT_CLINIC_OR_DEPARTMENT_OTHER): Payer: Self-pay | Admitting: *Deleted

## 2019-01-30 DIAGNOSIS — S41112A Laceration without foreign body of left upper arm, initial encounter: Secondary | ICD-10-CM

## 2019-01-30 DIAGNOSIS — Y9289 Other specified places as the place of occurrence of the external cause: Secondary | ICD-10-CM | POA: Insufficient documentation

## 2019-01-30 DIAGNOSIS — Y998 Other external cause status: Secondary | ICD-10-CM | POA: Insufficient documentation

## 2019-01-30 DIAGNOSIS — F17210 Nicotine dependence, cigarettes, uncomplicated: Secondary | ICD-10-CM | POA: Insufficient documentation

## 2019-01-30 DIAGNOSIS — S51812A Laceration without foreign body of left forearm, initial encounter: Secondary | ICD-10-CM | POA: Insufficient documentation

## 2019-01-30 DIAGNOSIS — W228XXA Striking against or struck by other objects, initial encounter: Secondary | ICD-10-CM | POA: Insufficient documentation

## 2019-01-30 DIAGNOSIS — Y9389 Activity, other specified: Secondary | ICD-10-CM | POA: Insufficient documentation

## 2019-01-30 DIAGNOSIS — Z23 Encounter for immunization: Secondary | ICD-10-CM | POA: Insufficient documentation

## 2019-01-30 NOTE — ED Triage Notes (Signed)
Laceration to his left forearm on a piece of metal while installing a metal roof. Bleeding controlled.

## 2019-01-31 MED ORDER — LIDOCAINE HCL (PF) 1 % IJ SOLN
5.0000 mL | Freq: Once | INTRAMUSCULAR | Status: AC
  Administered 2019-01-31: 01:00:00 5 mL via INTRADERMAL
  Filled 2019-01-31: qty 5

## 2019-01-31 MED ORDER — LIDOCAINE HCL (PF) 1 % IJ SOLN
5.0000 mL | Freq: Once | INTRAMUSCULAR | Status: AC
  Administered 2019-01-31: 02:00:00 5 mL via INTRADERMAL
  Filled 2019-01-31: qty 5

## 2019-01-31 MED ORDER — TETANUS-DIPHTH-ACELL PERTUSSIS 5-2.5-18.5 LF-MCG/0.5 IM SUSP
0.5000 mL | Freq: Once | INTRAMUSCULAR | Status: AC
  Administered 2019-01-31: 0.5 mL via INTRAMUSCULAR
  Filled 2019-01-31: qty 0.5
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
## 2019-01-31 NOTE — Discharge Instructions (Signed)
You may alternate Tylenol 1000 mg every 6 hours as needed for pain and Ibuprofen 800 mg every 8 hours as needed for pain.  Please take Ibuprofen with food. ° °

## 2019-11-25 ENCOUNTER — Other Ambulatory Visit: Payer: Self-pay

## 2019-11-25 ENCOUNTER — Emergency Department (HOSPITAL_BASED_OUTPATIENT_CLINIC_OR_DEPARTMENT_OTHER): Payer: Self-pay

## 2019-11-25 ENCOUNTER — Emergency Department (HOSPITAL_BASED_OUTPATIENT_CLINIC_OR_DEPARTMENT_OTHER)
Admission: EM | Admit: 2019-11-25 | Discharge: 2019-11-25 | Disposition: A | Discharge status: Home / Self Care | Payer: Self-pay | Attending: Emergency Medicine | Admitting: Emergency Medicine

## 2019-11-25 ENCOUNTER — Encounter (HOSPITAL_BASED_OUTPATIENT_CLINIC_OR_DEPARTMENT_OTHER): Payer: Self-pay | Admitting: Emergency Medicine

## 2019-11-25 DIAGNOSIS — F17210 Nicotine dependence, cigarettes, uncomplicated: Secondary | ICD-10-CM | POA: Insufficient documentation

## 2019-11-25 DIAGNOSIS — B353 Tinea pedis: Secondary | ICD-10-CM | POA: Insufficient documentation

## 2019-11-25 DIAGNOSIS — J45909 Unspecified asthma, uncomplicated: Secondary | ICD-10-CM | POA: Insufficient documentation

## 2019-11-25 DIAGNOSIS — L03116 Cellulitis of left lower limb: Secondary | ICD-10-CM | POA: Insufficient documentation

## 2019-11-25 MED ORDER — DOXYCYCLINE HYCLATE 100 MG PO CAPS: 100.0000 mg | ORAL_CAPSULE | Freq: Two times a day (BID) | ORAL | 0 refills | Status: DC

## 2019-11-25 MED ORDER — DOXYCYCLINE HYCLATE 100 MG PO TABS
100.0000 mg | ORAL_TABLET | Freq: Once | ORAL | Status: AC
  Administered 2019-11-25: 100 mg via ORAL
  Filled 2019-11-25: qty 1

## 2019-11-25 MED ORDER — CLOTRIMAZOLE 1 % EX CREA: TOPICAL_CREAM | CUTANEOUS | 0 refills | Status: DC
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
## 2019-11-25 NOTE — Discharge Instructions (Addendum)
Your xray is normal. Please apply the cream to the rash on your feet as directed. Try to wear open toed shoes to help minimize moisture on your skin, as this can cause fungal infection (Athletes foot) Take the antibiotic, starting this evening, as directed until gone. Return to PCP/urgent care or ER in 3 days for recheck. If symptoms worsen, report to the ER.

## 2019-11-25 NOTE — ED Triage Notes (Signed)
L foot pain and swelling x 3 days. No known injury.

## 2019-11-28 ENCOUNTER — Emergency Department (HOSPITAL_COMMUNITY): Payer: Self-pay

## 2019-11-28 ENCOUNTER — Emergency Department (HOSPITAL_COMMUNITY)
Admission: EM | Admit: 2019-11-28 | Discharge: 2019-11-28 | Disposition: A | Discharge status: Home / Self Care | Payer: Self-pay | Attending: Emergency Medicine | Admitting: Emergency Medicine

## 2019-11-28 ENCOUNTER — Other Ambulatory Visit: Payer: Self-pay

## 2019-11-28 DIAGNOSIS — L03116 Cellulitis of left lower limb: Secondary | ICD-10-CM | POA: Insufficient documentation

## 2019-11-28 DIAGNOSIS — F17210 Nicotine dependence, cigarettes, uncomplicated: Secondary | ICD-10-CM | POA: Insufficient documentation

## 2019-11-28 LAB — URINALYSIS, ROUTINE W REFLEX MICROSCOPIC
Bilirubin Urine: NEGATIVE
Glucose, UA: NEGATIVE mg/dL
Hgb urine dipstick: NEGATIVE
Ketones, ur: NEGATIVE mg/dL
Leukocytes,Ua: NEGATIVE
Nitrite: NEGATIVE
Protein, ur: NEGATIVE mg/dL
Specific Gravity, Urine: 1.017 (ref 1.005–1.030)
pH: 8 (ref 5.0–8.0)

## 2019-11-28 LAB — COMPREHENSIVE METABOLIC PANEL
ALT: 18 U/L (ref 0–44)
AST: 21 U/L (ref 15–41)
Albumin: 3.9 g/dL (ref 3.5–5.0)
Alkaline Phosphatase: 79 U/L (ref 38–126)
Anion gap: 14 (ref 5–15)
BUN: 9 mg/dL (ref 6–20)
CO2: 23 mmol/L (ref 22–32)
Calcium: 9.4 mg/dL (ref 8.9–10.3)
Chloride: 102 mmol/L (ref 98–111)
Creatinine, Ser: 0.83 mg/dL (ref 0.61–1.24)
GFR calc Af Amer: >60 mL/min (ref >60)
GFR calc non Af Amer: >60 mL/min (ref >60)
Glucose, Bld: 109 mg/dL — ABNORMAL HIGH (ref 70–99)
Potassium: 3.7 mmol/L (ref 3.5–5.1)
Sodium: 139 mmol/L (ref 135–145)
Total Bilirubin: 0.7 mg/dL (ref 0.3–1.2)
Total Protein: 7.1 g/dL (ref 6.5–8.1)

## 2019-11-28 LAB — CBC WITH DIFFERENTIAL/PLATELET
Abs Immature Granulocytes: 0.04 10*3/uL (ref 0.00–0.07)
Basophils Absolute: 0.1 10*3/uL (ref 0.0–0.1)
Basophils Relative: 1 %
Eosinophils Absolute: 0.4 10*3/uL (ref 0.0–0.5)
Eosinophils Relative: 6 %
HCT: 51.9 % (ref 39.0–52.0)
Hemoglobin: 17.4 g/dL — ABNORMAL HIGH (ref 13.0–17.0)
Immature Granulocytes: 1 %
Lymphocytes Relative: 35 %
Lymphs Abs: 2 10*3/uL (ref 0.7–4.0)
MCH: 30.6 pg (ref 26.0–34.0)
MCHC: 33.5 g/dL (ref 30.0–36.0)
MCV: 91.4 fL (ref 80.0–100.0)
Monocytes Absolute: 0.4 10*3/uL (ref 0.1–1.0)
Monocytes Relative: 7 %
Neutro Abs: 3 10*3/uL (ref 1.7–7.7)
Neutrophils Relative %: 50 %
Platelets: 266 10*3/uL (ref 150–400)
RBC: 5.68 MIL/uL (ref 4.22–5.81)
RDW: 12.8 % (ref 11.5–15.5)
WBC: 5.9 10*3/uL (ref 4.0–10.5)
nRBC: 0 % (ref 0.0–0.2)

## 2019-11-28 LAB — LACTIC ACID, PLASMA: Lactic Acid, Venous: 1.1 mmol/L (ref 0.5–1.9)

## 2019-11-28 MED ORDER — CIPROFLOXACIN HCL 500 MG PO TABS: 500.0000 mg | ORAL_TABLET | Freq: Two times a day (BID) | ORAL | 0 refills | Status: AC

## 2019-11-28 NOTE — ED Triage Notes (Signed)
Pt here for eval of foot swelling, redness, and pain since last week. Also endorses bloody drainage from same today. Seen at [facility] for same and given doxycycline, which he has been taking, but reports that he has seen no improvement.

## 2019-11-28 NOTE — ED Notes (Signed)
Patient verbalizes understanding of discharge instructions. Opportunity for questioning and answers were provided. Armband removed by staff, pt discharged from ED ambulatory by self\  
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
## 2019-11-28 NOTE — Discharge Instructions (Signed)
Continue taking antibiotics as prescribed.  Take entire course, even if your symptoms improve. Continue using the cream on your foot to help treat the athlete's foot. Continue to monitor for worsening signs of infection.  Return to the ER if you develop fevers, streaking up your leg, or any new, worsening, or concerning symptoms.

## 2020-03-14 ENCOUNTER — Emergency Department (HOSPITAL_COMMUNITY)
Admission: EM | Admit: 2020-03-14 | Discharge: 2020-03-14 | Disposition: A | Discharge status: Home / Self Care | Payer: Medicaid Other | Attending: Emergency Medicine | Admitting: Emergency Medicine

## 2020-03-14 ENCOUNTER — Other Ambulatory Visit: Payer: Self-pay

## 2020-03-14 ENCOUNTER — Encounter (HOSPITAL_COMMUNITY): Payer: Self-pay | Admitting: Emergency Medicine

## 2020-03-14 DIAGNOSIS — F1310 Sedative, hypnotic or anxiolytic abuse, uncomplicated: Secondary | ICD-10-CM | POA: Insufficient documentation

## 2020-03-14 DIAGNOSIS — F1110 Opioid abuse, uncomplicated: Secondary | ICD-10-CM | POA: Insufficient documentation

## 2020-03-14 DIAGNOSIS — F1910 Other psychoactive substance abuse, uncomplicated: Secondary | ICD-10-CM

## 2020-03-14 DIAGNOSIS — J45909 Unspecified asthma, uncomplicated: Secondary | ICD-10-CM | POA: Insufficient documentation

## 2020-03-14 DIAGNOSIS — F17210 Nicotine dependence, cigarettes, uncomplicated: Secondary | ICD-10-CM | POA: Insufficient documentation

## 2020-03-14 NOTE — Discharge Instructions (Addendum)
It was our pleasure to provide your ER care today - we hope that you feel better.  See resource guide provided as relates substance abuse treatment centers, and other community resources.   Also follow up with primary care doctor in the next 1-2 weeks.   For mental health issues and/or crisis, you may go directly to the [facility] (open 24/7).   Return to ER if worse, new symptoms, fevers, trouble breathing, or other medical emergency.  Return to ER if worse, new symptoms,

## 2020-03-14 NOTE — ED Triage Notes (Signed)
Patient is not si/hi. Patient wants help getting of heroine and xanax.
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
## 2020-03-26 ENCOUNTER — Other Ambulatory Visit: Payer: Self-pay

## 2020-03-26 ENCOUNTER — Emergency Department (HOSPITAL_BASED_OUTPATIENT_CLINIC_OR_DEPARTMENT_OTHER)
Admission: EM | Admit: 2020-03-26 | Discharge: 2020-03-26 | Disposition: A | Discharge status: Home / Self Care | Payer: Medicaid Other | Attending: Emergency Medicine | Admitting: Emergency Medicine

## 2020-03-26 ENCOUNTER — Encounter (HOSPITAL_BASED_OUTPATIENT_CLINIC_OR_DEPARTMENT_OTHER): Payer: Self-pay | Admitting: *Deleted

## 2020-03-26 DIAGNOSIS — H6690 Otitis media, unspecified, unspecified ear: Secondary | ICD-10-CM | POA: Insufficient documentation

## 2020-03-26 DIAGNOSIS — Z01818 Encounter for other preprocedural examination: Secondary | ICD-10-CM | POA: Insufficient documentation

## 2020-03-26 DIAGNOSIS — F1910 Other psychoactive substance abuse, uncomplicated: Secondary | ICD-10-CM

## 2020-03-26 DIAGNOSIS — J45909 Unspecified asthma, uncomplicated: Secondary | ICD-10-CM | POA: Insufficient documentation

## 2020-03-26 DIAGNOSIS — F17210 Nicotine dependence, cigarettes, uncomplicated: Secondary | ICD-10-CM | POA: Insufficient documentation

## 2020-03-26 HISTORY — DX: Other psychoactive substance abuse, uncomplicated: F19.10

## 2020-03-26 LAB — URINALYSIS, ROUTINE W REFLEX MICROSCOPIC
Bilirubin Urine: NEGATIVE
Glucose, UA: NEGATIVE mg/dL
Hgb urine dipstick: NEGATIVE
Ketones, ur: NEGATIVE mg/dL
Leukocytes,Ua: NEGATIVE
Nitrite: NEGATIVE
Protein, ur: NEGATIVE mg/dL
Specific Gravity, Urine: 1.015 (ref 1.005–1.030)
pH: 7 (ref 5.0–8.0)

## 2020-03-26 LAB — ETHANOL: Alcohol, Ethyl (B): <10 mg/dL (ref <10)

## 2020-03-26 LAB — RAPID URINE DRUG SCREEN, HOSP PERFORMED
Amphetamines: NOT DETECTED
Barbiturates: NOT DETECTED
Benzodiazepines: NOT DETECTED
Cocaine: NOT DETECTED
Opiates: NOT DETECTED
Tetrahydrocannabinol: POSITIVE — AB

## 2020-03-26 LAB — CBC WITH DIFFERENTIAL/PLATELET
Abs Immature Granulocytes: 0.03 10*3/uL (ref 0.00–0.07)
Basophils Absolute: 0.1 10*3/uL (ref 0.0–0.1)
Basophils Relative: 1 %
Eosinophils Absolute: 0.2 10*3/uL (ref 0.0–0.5)
Eosinophils Relative: 3 %
HCT: 49.8 % (ref 39.0–52.0)
Hemoglobin: 16.9 g/dL (ref 13.0–17.0)
Immature Granulocytes: 0 %
Lymphocytes Relative: 33 %
Lymphs Abs: 2.5 10*3/uL (ref 0.7–4.0)
MCH: 29.3 pg (ref 26.0–34.0)
MCHC: 33.9 g/dL (ref 30.0–36.0)
MCV: 86.5 fL (ref 80.0–100.0)
Monocytes Absolute: 0.5 10*3/uL (ref 0.1–1.0)
Monocytes Relative: 7 %
Neutro Abs: 4.2 10*3/uL (ref 1.7–7.7)
Neutrophils Relative %: 56 %
Platelets: 260 10*3/uL (ref 150–400)
RBC: 5.76 MIL/uL (ref 4.22–5.81)
RDW: 12.5 % (ref 11.5–15.5)
WBC: 7.5 10*3/uL (ref 4.0–10.5)
nRBC: 0 % (ref 0.0–0.2)

## 2020-03-26 LAB — COMPREHENSIVE METABOLIC PANEL
ALT: 13 U/L (ref 0–44)
AST: 19 U/L (ref 15–41)
Albumin: 4.2 g/dL (ref 3.5–5.0)
Alkaline Phosphatase: 68 U/L (ref 38–126)
Anion gap: 12 (ref 5–15)
BUN: 12 mg/dL (ref 6–20)
CO2: 25 mmol/L (ref 22–32)
Calcium: 9.2 mg/dL (ref 8.9–10.3)
Chloride: 100 mmol/L (ref 98–111)
Creatinine, Ser: 0.7 mg/dL (ref 0.61–1.24)
GFR calc non Af Amer: >60 mL/min (ref >60)
Glucose, Bld: 93 mg/dL (ref 70–99)
Potassium: 4.2 mmol/L (ref 3.5–5.1)
Sodium: 137 mmol/L (ref 135–145)
Total Bilirubin: 0.4 mg/dL (ref 0.3–1.2)
Total Protein: 7.2 g/dL (ref 6.5–8.1)

## 2020-03-26 LAB — ACETAMINOPHEN LEVEL: Acetaminophen (Tylenol), Serum: <10 ug/mL — ABNORMAL LOW (ref 10–30)

## 2020-03-26 NOTE — Discharge Instructions (Addendum)
Duane Cruz' vital signs are normal, physical examination is unremarkable, and laboratory studies including CBC, basic metabolic panel, urinalysis are all reassuring.  He is on the appropriate medication drops for his otitis externa.  Duane Cruz is medically cleared to enter treatment program.
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
## 2020-03-26 NOTE — ED Triage Notes (Signed)
Pt sent here by [facility] rehab for medical clearance for bed at [facility]

## 2020-04-02 ENCOUNTER — Encounter (HOSPITAL_BASED_OUTPATIENT_CLINIC_OR_DEPARTMENT_OTHER): Payer: Self-pay | Admitting: Emergency Medicine

## 2020-04-02 ENCOUNTER — Other Ambulatory Visit: Payer: Self-pay

## 2020-04-02 ENCOUNTER — Emergency Department (HOSPITAL_BASED_OUTPATIENT_CLINIC_OR_DEPARTMENT_OTHER)
Admission: EM | Admit: 2020-04-02 | Discharge: 2020-04-02 | Disposition: A | Discharge status: Home / Self Care | Payer: Medicaid Other | Attending: Emergency Medicine | Admitting: Emergency Medicine

## 2020-04-02 DIAGNOSIS — F1910 Other psychoactive substance abuse, uncomplicated: Secondary | ICD-10-CM | POA: Insufficient documentation

## 2020-04-02 DIAGNOSIS — F17210 Nicotine dependence, cigarettes, uncomplicated: Secondary | ICD-10-CM | POA: Insufficient documentation

## 2020-04-02 DIAGNOSIS — G479 Sleep disorder, unspecified: Secondary | ICD-10-CM | POA: Insufficient documentation

## 2020-04-02 DIAGNOSIS — J45909 Unspecified asthma, uncomplicated: Secondary | ICD-10-CM | POA: Insufficient documentation

## 2020-04-02 MED ORDER — ALBUTEROL SULFATE HFA 108 (90 BASE) MCG/ACT IN AERS: 2.0000 | INHALATION_SPRAY | RESPIRATORY_TRACT | 1 refills | Status: AC | PRN

## 2020-04-02 NOTE — Discharge Instructions (Addendum)
Clinical summary: 24 year old male with history of drug addiction, remote history of asthma presenting to the emergency department today for medical clearance.  Has recently been diagnosed with otitis externa and is being properly treated with antibiotic drops.  On exam today, patient is without ear pain or hearing issues, exam of the ears is overall reassuring.  Patient is advised to complete his antibiotic course.  He has no other complaints and he is medically cleared for further mental health and addiction care at [facility].  Will provide refill of albuterol inhaler, to be used as needed for wheezing.

## 2020-04-02 NOTE — ED Triage Notes (Signed)
Pt attempting to get in to [facility], but since he was recently treated for an ear infection (here) he has to have a statement saying that he is cleared to go to [facility]. Pt sts he is almost finished with the med and his ear feels better.  Also, since he has Asthma in his health history and his inhaler is expired (because he has not had an attack in years), he has to have a new inhaler before they will take him.   He sts he was also told that they need [place] to Fax them a "clinical summary."
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
## 2020-04-09 ENCOUNTER — Other Ambulatory Visit: Payer: Self-pay

## 2020-04-09 ENCOUNTER — Telehealth (INDEPENDENT_AMBULATORY_CARE_PROVIDER_SITE_OTHER): Payer: No Payment, Other | Admitting: Physician Assistant

## 2020-04-09 DIAGNOSIS — G4700 Insomnia, unspecified: Secondary | ICD-10-CM | POA: Insufficient documentation

## 2020-04-09 DIAGNOSIS — F411 Generalized anxiety disorder: Secondary | ICD-10-CM | POA: Diagnosis not present

## 2020-04-09 MED ORDER — TRAZODONE HCL 50 MG PO TABS: 50.0000 mg | ORAL_TABLET | Freq: Every evening | ORAL | 1 refills | Status: AC | PRN

## 2020-04-09 MED ORDER — HYDROXYZINE HCL 25 MG PO TABS: 25.0000 mg | ORAL_TABLET | Freq: Three times a day (TID) | ORAL | 1 refills | Status: AC | PRN

## 2020-04-12 ENCOUNTER — Encounter (HOSPITAL_COMMUNITY): Payer: Self-pay | Admitting: Physician Assistant
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
## 2020-05-07 ENCOUNTER — Other Ambulatory Visit: Payer: Self-pay

## 2020-05-07 ENCOUNTER — Telehealth (HOSPITAL_COMMUNITY): Payer: No Payment, Other | Admitting: Physician Assistant

## 2020-05-19 ENCOUNTER — Emergency Department (HOSPITAL_COMMUNITY): Payer: No Typology Code available for payment source

## 2020-05-19 ENCOUNTER — Encounter (HOSPITAL_COMMUNITY): Payer: Self-pay | Admitting: Emergency Medicine

## 2020-05-19 ENCOUNTER — Inpatient Hospital Stay (HOSPITAL_COMMUNITY)
Admission: EM | Admit: 2020-05-19 | Discharge: 2020-05-21 | DRG: 917 | Disposition: A | Discharge status: Home / Self Care | Payer: No Typology Code available for payment source | Attending: Emergency Medicine | Admitting: Emergency Medicine

## 2020-05-19 DIAGNOSIS — T401X4A Poisoning by heroin, undetermined, initial encounter: Secondary | ICD-10-CM

## 2020-05-19 DIAGNOSIS — T40605A Adverse effect of unspecified narcotics, initial encounter: Secondary | ICD-10-CM

## 2020-05-19 DIAGNOSIS — E876 Hypokalemia: Secondary | ICD-10-CM | POA: Insufficient documentation

## 2020-05-19 DIAGNOSIS — G9340 Encephalopathy, unspecified: Secondary | ICD-10-CM

## 2020-05-19 DIAGNOSIS — T40601A Poisoning by unspecified narcotics, accidental (unintentional), initial encounter: Secondary | ICD-10-CM

## 2020-05-19 DIAGNOSIS — R0689 Other abnormalities of breathing: Secondary | ICD-10-CM

## 2020-05-19 DIAGNOSIS — T401X1A Poisoning by heroin, accidental (unintentional), initial encounter: Secondary | ICD-10-CM | POA: Diagnosis present

## 2020-05-19 DIAGNOSIS — Z79899 Other long term (current) drug therapy: Secondary | ICD-10-CM | POA: Diagnosis not present

## 2020-05-19 DIAGNOSIS — N179 Acute kidney failure, unspecified: Secondary | ICD-10-CM | POA: Diagnosis present

## 2020-05-19 DIAGNOSIS — Y92410 Unspecified street and highway as the place of occurrence of the external cause: Secondary | ICD-10-CM | POA: Diagnosis not present

## 2020-05-19 DIAGNOSIS — R739 Hyperglycemia, unspecified: Secondary | ICD-10-CM | POA: Diagnosis present

## 2020-05-19 DIAGNOSIS — R230 Cyanosis: Secondary | ICD-10-CM | POA: Diagnosis present

## 2020-05-19 DIAGNOSIS — Z20822 Contact with and (suspected) exposure to covid-19: Secondary | ICD-10-CM | POA: Diagnosis present

## 2020-05-19 DIAGNOSIS — G928 Other toxic encephalopathy: Secondary | ICD-10-CM | POA: Diagnosis present

## 2020-05-19 LAB — CBC WITH DIFFERENTIAL/PLATELET
Abs Immature Granulocytes: 0.1 10*3/uL — ABNORMAL HIGH (ref 0.00–0.07)
Basophils Absolute: 0.1 10*3/uL (ref 0.0–0.1)
Basophils Relative: 1 %
Eosinophils Absolute: 0.4 10*3/uL (ref 0.0–0.5)
Eosinophils Relative: 4 %
HCT: 49.9 % (ref 39.0–52.0)
Hemoglobin: 16.4 g/dL (ref 13.0–17.0)
Immature Granulocytes: 1 %
Lymphocytes Relative: 50 %
Lymphs Abs: 5.8 10*3/uL — ABNORMAL HIGH (ref 0.7–4.0)
MCH: 29.5 pg (ref 26.0–34.0)
MCHC: 32.9 g/dL (ref 30.0–36.0)
MCV: 89.7 fL (ref 80.0–100.0)
Monocytes Absolute: 0.9 10*3/uL (ref 0.1–1.0)
Monocytes Relative: 8 %
Neutro Abs: 4.2 10*3/uL (ref 1.7–7.7)
Neutrophils Relative %: 36 %
Platelets: 325 10*3/uL (ref 150–400)
RBC: 5.56 MIL/uL (ref 4.22–5.81)
RDW: 13.5 % (ref 11.5–15.5)
WBC: 11.6 10*3/uL — ABNORMAL HIGH (ref 4.0–10.5)
nRBC: 0 % (ref 0.0–0.2)

## 2020-05-19 LAB — RESP PANEL BY RT-PCR (FLU A&B, COVID) ARPGX2
Influenza A by PCR: NEGATIVE
Influenza B by PCR: NEGATIVE
SARS Coronavirus 2 by RT PCR: NEGATIVE

## 2020-05-19 LAB — COMPREHENSIVE METABOLIC PANEL
ALT: 14 U/L (ref 0–44)
AST: 27 U/L (ref 15–41)
Albumin: 4.1 g/dL (ref 3.5–5.0)
Alkaline Phosphatase: 90 U/L (ref 38–126)
Anion gap: 10 (ref 5–15)
BUN: 16 mg/dL (ref 6–20)
CO2: 28 mmol/L (ref 22–32)
Calcium: 9.4 mg/dL (ref 8.9–10.3)
Chloride: 103 mmol/L (ref 98–111)
Creatinine, Ser: 1.43 mg/dL — ABNORMAL HIGH (ref 0.61–1.24)
GFR, Estimated: >60 mL/min (ref >60)
Glucose, Bld: 211 mg/dL — ABNORMAL HIGH (ref 70–99)
Potassium: 3.3 mmol/L — ABNORMAL LOW (ref 3.5–5.1)
Sodium: 141 mmol/L (ref 135–145)
Total Bilirubin: 0.4 mg/dL (ref 0.3–1.2)
Total Protein: 7 g/dL (ref 6.5–8.1)

## 2020-05-19 MED ORDER — NALOXONE HCL 4 MG/10ML IJ SOLN
0.2500 mg/h | INTRAVENOUS | Status: DC
  Administered 2020-05-19: 0.25 mg/h via INTRAVENOUS
  Filled 2020-05-19: qty 10

## 2020-05-19 MED ORDER — NALOXONE HCL 0.4 MG/ML IJ SOLN
INTRAMUSCULAR | Status: AC
  Administered 2020-05-19: 0.4 mg/mL
  Filled 2020-05-19: qty 1

## 2020-05-19 MED ORDER — LACTATED RINGERS IV BOLUS
1000.0000 mL | Freq: Once | INTRAVENOUS | Status: AC
  Administered 2020-05-19: 1000 mL via INTRAVENOUS

## 2020-05-19 MED ORDER — POTASSIUM CHLORIDE CRYS ER 20 MEQ PO TBCR
40.0000 meq | EXTENDED_RELEASE_TABLET | Freq: Once | ORAL | Status: AC
  Administered 2020-05-20: 40 meq via ORAL
  Filled 2020-05-19: qty 2

## 2020-05-19 MED ORDER — HEPARIN SODIUM (PORCINE) 5000 UNIT/ML IJ SOLN
5000.0000 [IU] | Freq: Three times a day (TID) | INTRAMUSCULAR | Status: DC
  Administered 2020-05-20 – 2020-05-21 (×4): 5000 [IU] via SUBCUTANEOUS
  Filled 2020-05-19 (×4): qty 1

## 2020-05-19 MED ORDER — NALOXONE HCL 4 MG/10ML IJ SOLN: 0.2500 mg/h | INTRAVENOUS | Status: DC

## 2020-05-19 MED ORDER — DOCUSATE SODIUM 100 MG PO CAPS: 100.0000 mg | ORAL_CAPSULE | Freq: Two times a day (BID) | ORAL | Status: DC | PRN

## 2020-05-19 MED ORDER — POLYETHYLENE GLYCOL 3350 17 G PO PACK: 17.0000 g | PACK | Freq: Every day | ORAL | Status: DC | PRN

## 2020-05-19 MED ORDER — INSULIN ASPART 100 UNIT/ML ~~LOC~~ SOLN: 0.0000 [IU] | SUBCUTANEOUS | Status: DC

## 2020-05-19 MED ORDER — LACTATED RINGERS IV SOLN: INTRAVENOUS | Status: DC
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
## 2020-05-19 NOTE — ED Notes (Signed)
Pt called his mother, update given.
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
## 2020-05-19 NOTE — ED Notes (Signed)
Pt with pinpoint pupils, apneic, EDP at bedside. 0.4mg  IV narcan given.

## 2020-05-19 NOTE — ED Notes (Signed)
Pt resting, easily aroused

## 2020-05-19 NOTE — ED Notes (Signed)
Pt desated to 77% on room air. Placed on 4L [place] with improvement.

## 2020-05-20 ENCOUNTER — Inpatient Hospital Stay (HOSPITAL_COMMUNITY): Payer: No Typology Code available for payment source

## 2020-05-20 ENCOUNTER — Encounter (HOSPITAL_COMMUNITY): Payer: Self-pay | Admitting: Pulmonary Disease

## 2020-05-20 ENCOUNTER — Other Ambulatory Visit: Payer: Self-pay

## 2020-05-20 LAB — CBC
HCT: 45.2 % (ref 39.0–52.0)
Hemoglobin: 14.9 g/dL (ref 13.0–17.0)
MCH: 29.4 pg (ref 26.0–34.0)
MCHC: 33 g/dL (ref 30.0–36.0)
MCV: 89.3 fL (ref 80.0–100.0)
Platelets: 242 10*3/uL (ref 150–400)
RBC: 5.06 MIL/uL (ref 4.22–5.81)
RDW: 13.9 % (ref 11.5–15.5)
WBC: 8.1 10*3/uL (ref 4.0–10.5)
nRBC: 0 % (ref 0.0–0.2)

## 2020-05-20 LAB — BASIC METABOLIC PANEL
Anion gap: 11 (ref 5–15)
BUN: 15 mg/dL (ref 6–20)
CO2: 27 mmol/L (ref 22–32)
Calcium: 8.8 mg/dL — ABNORMAL LOW (ref 8.9–10.3)
Chloride: 100 mmol/L (ref 98–111)
Creatinine, Ser: 1.03 mg/dL (ref 0.61–1.24)
GFR, Estimated: >60 mL/min (ref >60)
Glucose, Bld: 119 mg/dL — ABNORMAL HIGH (ref 70–99)
Potassium: 4.3 mmol/L (ref 3.5–5.1)
Sodium: 138 mmol/L (ref 135–145)

## 2020-05-20 LAB — GLUCOSE, CAPILLARY
Glucose-Capillary: 99 mg/dL (ref 70–99)
Glucose-Capillary: 84 mg/dL (ref 70–99)
Glucose-Capillary: 77 mg/dL (ref 70–99)

## 2020-05-20 LAB — HIV ANTIBODY (ROUTINE TESTING W REFLEX): HIV Screen 4th Generation wRfx: NONREACTIVE

## 2020-05-20 LAB — RAPID URINE DRUG SCREEN, HOSP PERFORMED
Amphetamines: NOT DETECTED
Barbiturates: NOT DETECTED
Benzodiazepines: POSITIVE — AB
Cocaine: POSITIVE — AB
Opiates: NOT DETECTED
Tetrahydrocannabinol: POSITIVE — AB

## 2020-05-20 LAB — HEMOGLOBIN A1C
Hgb A1c MFr Bld: 5.1 % (ref 4.8–5.6)
Mean Plasma Glucose: 99.67 mg/dL

## 2020-05-20 LAB — PHOSPHORUS: Phosphorus: 4.6 mg/dL (ref 2.5–4.6)

## 2020-05-20 LAB — MRSA PCR SCREENING: MRSA by PCR: POSITIVE — AB

## 2020-05-20 LAB — MAGNESIUM: Magnesium: 2.1 mg/dL (ref 1.7–2.4)

## 2020-05-20 MED ORDER — MUPIROCIN 2 % EX OINT
1.0000 "application " | TOPICAL_OINTMENT | Freq: Two times a day (BID) | CUTANEOUS | Status: DC
  Administered 2020-05-20 – 2020-05-21 (×2): 1 via NASAL
  Filled 2020-05-20: qty 22

## 2020-05-20 MED ORDER — CHLORHEXIDINE GLUCONATE CLOTH 2 % EX PADS
6.0000 | MEDICATED_PAD | Freq: Every day | CUTANEOUS | Status: DC
  Administered 2020-05-21: 6 via TOPICAL

## 2020-05-20 MED ORDER — BUPRENORPHINE HCL-NALOXONE HCL 2-0.5 MG SL SUBL
1.0000 | SUBLINGUAL_TABLET | Freq: Every day | SUBLINGUAL | Status: DC
  Administered 2020-05-20 – 2020-05-21 (×2): 1 via SUBLINGUAL
  Filled 2020-05-20 (×2): qty 1
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
## 2020-05-20 NOTE — ED Notes (Signed)
Sister, Shawna Orleans [phone], can call as needed.
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
## 2020-05-21 ENCOUNTER — Emergency Department (HOSPITAL_COMMUNITY)
Admission: EM | Admit: 2020-05-21 | Discharge: 2020-06-21 | Disposition: E | Payer: Medicaid Other | Attending: Emergency Medicine | Admitting: Emergency Medicine

## 2020-05-21 DIAGNOSIS — F17210 Nicotine dependence, cigarettes, uncomplicated: Secondary | ICD-10-CM | POA: Insufficient documentation

## 2020-05-21 DIAGNOSIS — J45909 Unspecified asthma, uncomplicated: Secondary | ICD-10-CM | POA: Insufficient documentation

## 2020-05-21 DIAGNOSIS — T401X2A Poisoning by heroin, intentional self-harm, initial encounter: Secondary | ICD-10-CM | POA: Insufficient documentation

## 2020-05-21 DIAGNOSIS — T401X4A Poisoning by heroin, undetermined, initial encounter: Secondary | ICD-10-CM

## 2020-05-21 DIAGNOSIS — I469 Cardiac arrest, cause unspecified: Secondary | ICD-10-CM | POA: Insufficient documentation

## 2020-05-21 LAB — I-STAT CHEM 8, ED
BUN: 13 mg/dL (ref 6–20)
Calcium, Ion: 1.05 mmol/L — ABNORMAL LOW (ref 1.15–1.40)
Chloride: 109 mmol/L (ref 98–111)
Creatinine, Ser: 2.3 mg/dL — ABNORMAL HIGH (ref 0.61–1.24)
Glucose, Bld: 310 mg/dL — ABNORMAL HIGH (ref 70–99)
HCT: 50 % (ref 39.0–52.0)
Hemoglobin: 17 g/dL (ref 13.0–17.0)
Potassium: 4.8 mmol/L (ref 3.5–5.1)
Sodium: 142 mmol/L (ref 135–145)
TCO2: 14 mmol/L — ABNORMAL LOW (ref 22–32)

## 2020-05-21 LAB — CBG MONITORING, ED: Glucose-Capillary: 253 mg/dL — ABNORMAL HIGH (ref 70–99)

## 2020-05-27 ENCOUNTER — Encounter (HOSPITAL_COMMUNITY): Payer: Self-pay | Admitting: Physician Assistant
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
## 2020-06-21 NOTE — Progress Notes (Signed)
DISCHARGE NOTE HOME  Duane Cruz to be discharged Home per MD order. Discussed prescriptions and follow up appointments with the patient. Prescriptions given to patient; medication list explained in detail. Patient verbalized understanding.  Skin clean, dry and intact without evidence of skin break down, no evidence of skin tears noted. IV catheter discontinued intact. Site without signs and symptoms of complications. Dressing and pressure applied. Pt denies pain at the site currently. No complaints noted.  Patient free of lines, drains, and wounds.   An After Visit Summary (AVS) was printed and given to the patient. Patient escorted via wheelchair, and discharged home via private auto.  Pat Patrick, RN

## 2020-06-21 NOTE — ED Notes (Signed)
Pulse check: No pulse, Pt remains in asystole.

## 2020-06-21 NOTE — ED Triage Notes (Signed)
Pt came in via ems with cpr in progress. Pt was found unresponsive with pinpoint pupils and agonal breathing. EMS gave 8 epis, 3 mg narcan, of fluids. Pt has a hx of drug abuse . EMS reports pt was asystole despite cpr.

## 2020-06-21 NOTE — ED Notes (Signed)
One of amp of sodium bicarb given.

## 2020-06-21 NOTE — ED Notes (Addendum)
Compressions resumed

## 2020-06-21 NOTE — ED Notes (Signed)
One amp of calcium chloride in.

## 2020-06-21 NOTE — ED Notes (Signed)
Time of death 1559 

## 2020-06-21 NOTE — ED Notes (Signed)
1 mg Epi in.

## 2020-06-21 NOTE — ED Notes (Signed)
Recap: pt has no reflexes, no pulses, remains in asystole, pupils fixed and dilated.

## 2020-06-21 NOTE — Plan of Care (Signed)
  Problem: Clinical Measurements: Goal: Ability to maintain clinical measurements within normal limits will improve Outcome: Progressing   Problem: Clinical Measurements: Goal: Will remain free from infection Outcome: Progressing   Problem: Clinical Measurements: Goal: Diagnostic test results will improve Outcome: Progressing   Problem: Clinical Measurements: Goal: Respiratory complications will improve Outcome: Progressing   Problem: Clinical Measurements: Goal: Cardiovascular complication will be avoided Outcome: Progressing   Problem: Activity: Goal: Risk for activity intolerance will decrease Outcome: Progressing   Problem: Nutrition: Goal: Adequate nutrition will be maintained Outcome: Progressing   

## 2020-06-21 NOTE — Progress Notes (Signed)
   06-09-2020 1604  Clinical Encounter Type  Visited With Patient and family together  Visit Type Death  Referral From Nurse  Consult/Referral To Chaplain   Chaplain responded to page from ED. Pt's family was at bedside. Chaplain provided ministry of presence. Chaplain let Pt's family know that chaplains remain available as needed.   This note was prepared by Chaplain Resident, Tacy Learn, MDiv. Chaplain remains available as needed through the on-call pager: [phone].

## 2020-06-21 NOTE — ED Notes (Signed)
RN moved body to purple zone in ED for family to grieve.
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
## 2020-06-21 NOTE — ED Notes (Signed)
Pulse check; no pulse, asystole. Compressions resumed.

## 2020-06-21 DEATH — deceased

## 2020-08-09 IMAGING — DX DG FOOT COMPLETE 3+V*L*
3 series · 3 of 3 positions shown · non-contrast
Comparison: None.

CLINICAL DATA: Pain and swelling. Redness over the anterior LEFT
foot. No known injury. Pain in the metatarsal region.

EXAM:
LEFT FOOT - COMPLETE 3+ VIEW

[foot ap]
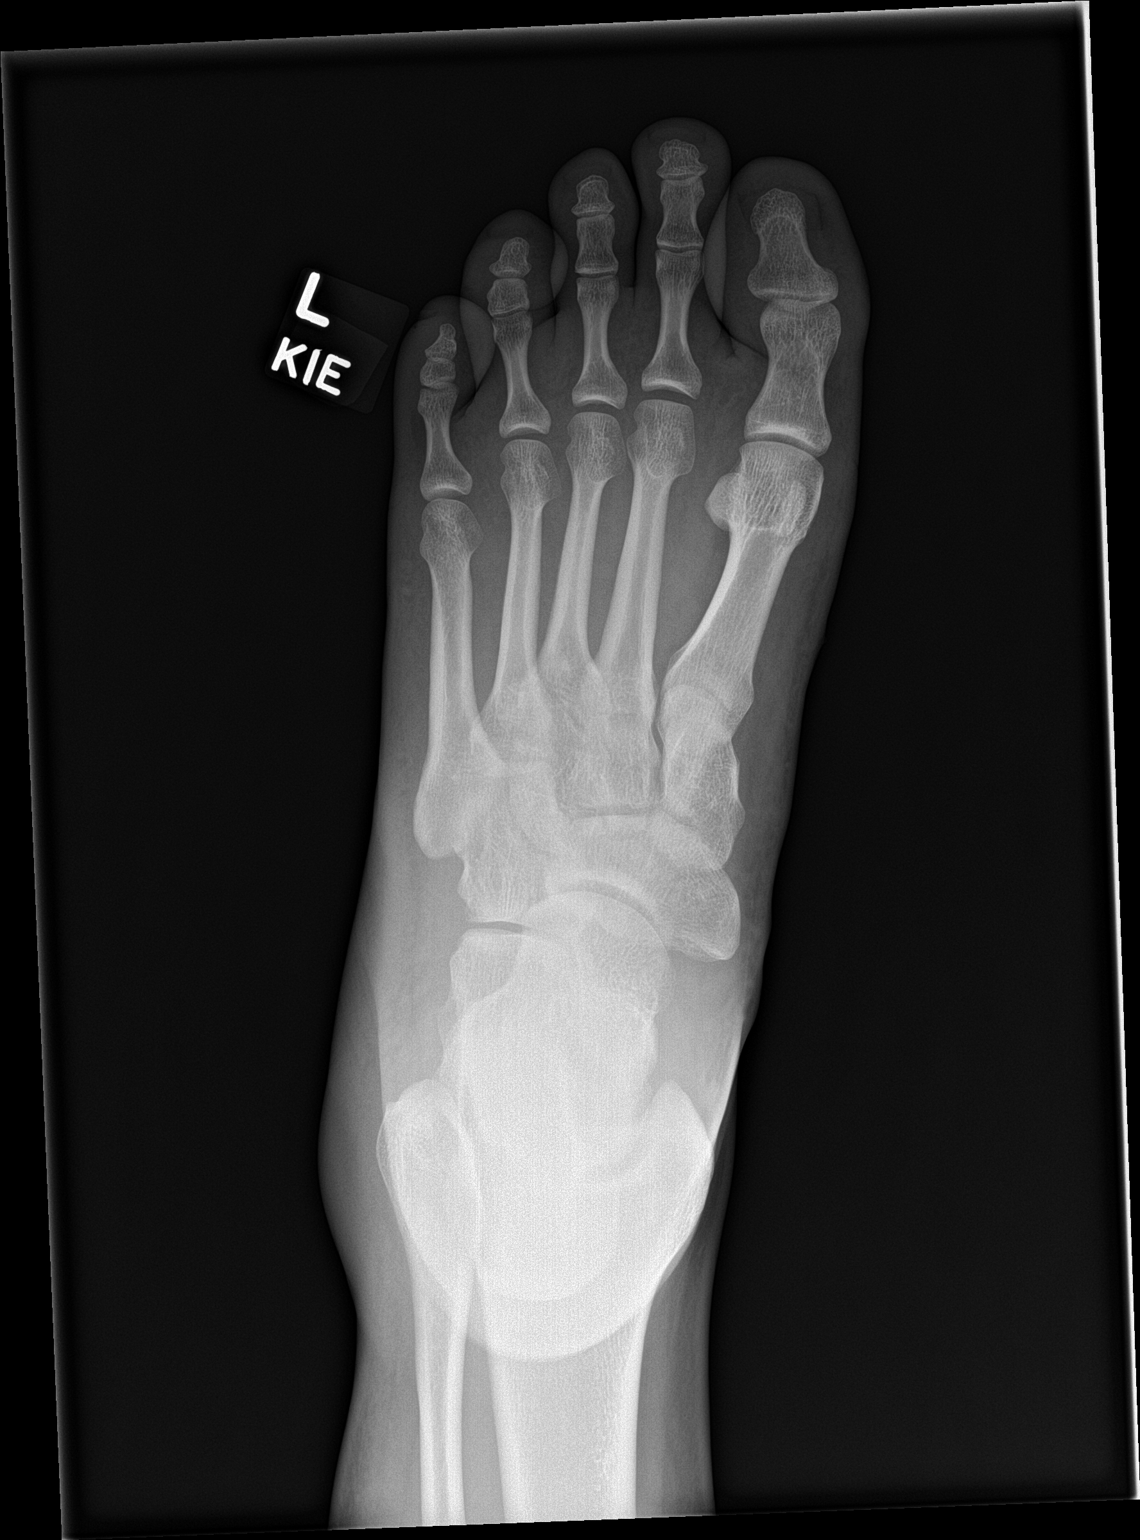

[foot obl]
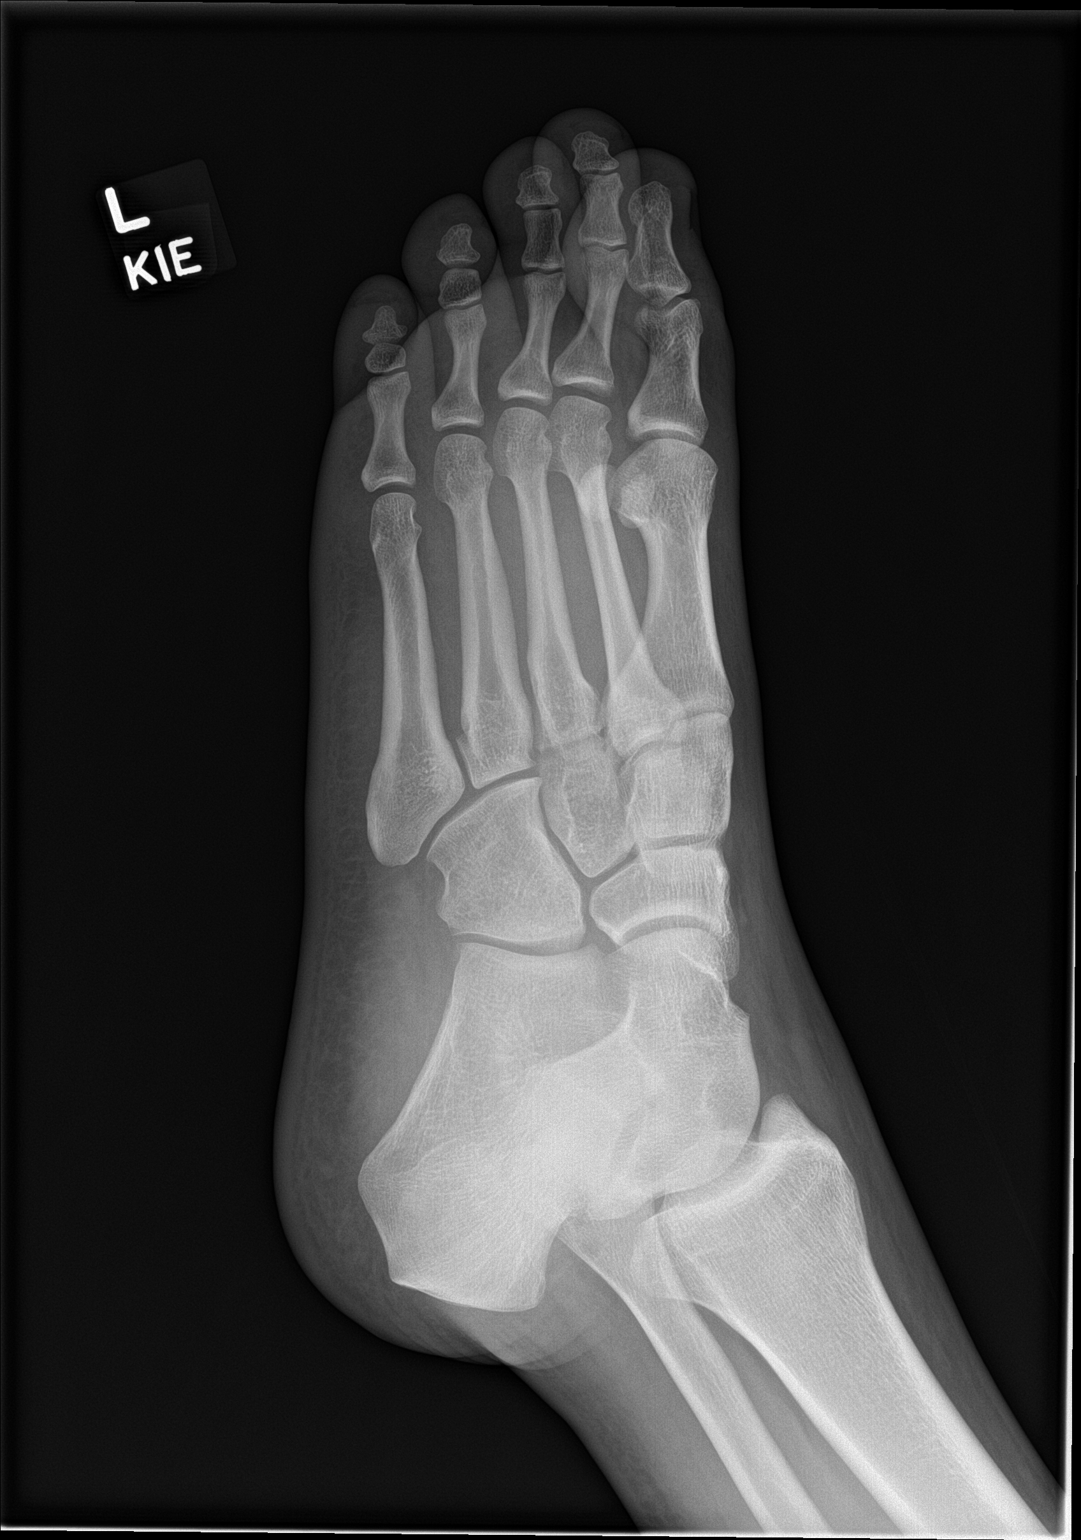

[foot lat]
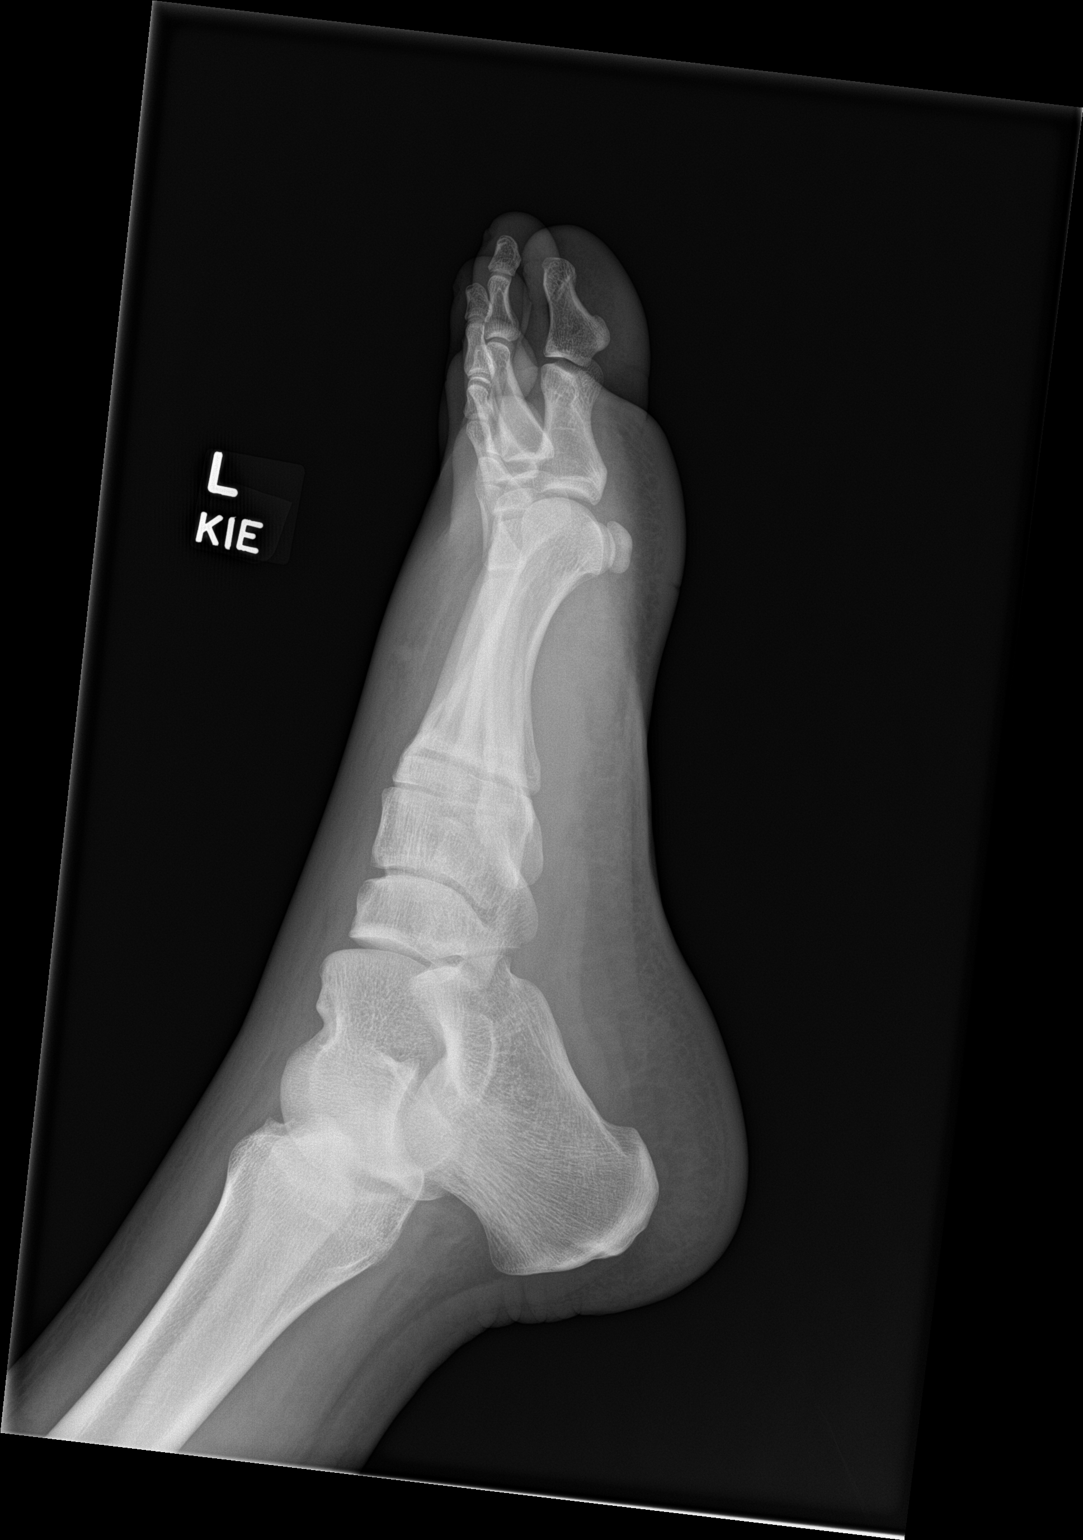

[3 of 3 positions shown; findings below may reference images not displayed]

FINDINGS: No acute fracture or subluxation. There is mild soft tissue swelling
along the dorsum of the forefoot.
IMPRESSION: No acute osseous abnormality.  Mild soft tissue swelling.

## 2020-08-12 IMAGING — CR DG FOOT COMPLETE 3+V*L*
3 series · 3 of 3 positions shown · non-contrast
Comparison: November 25, 2019

CLINICAL DATA: Left foot swelling and redness.

EXAM:
LEFT FOOT - COMPLETE 3+ VIEW

[foot ap]
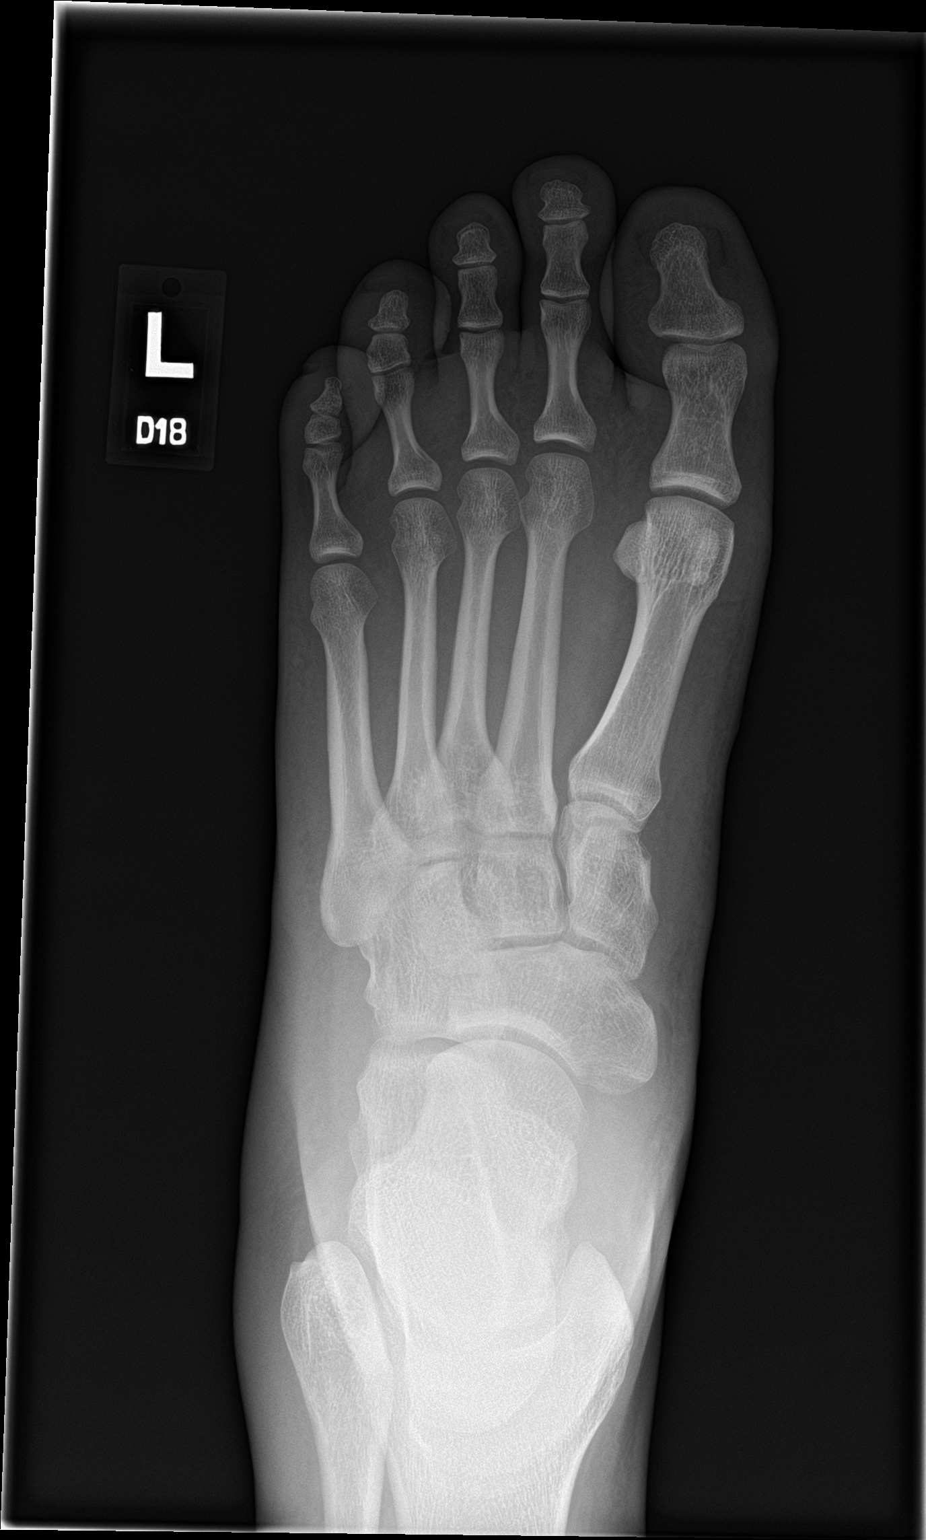

[foot obl]
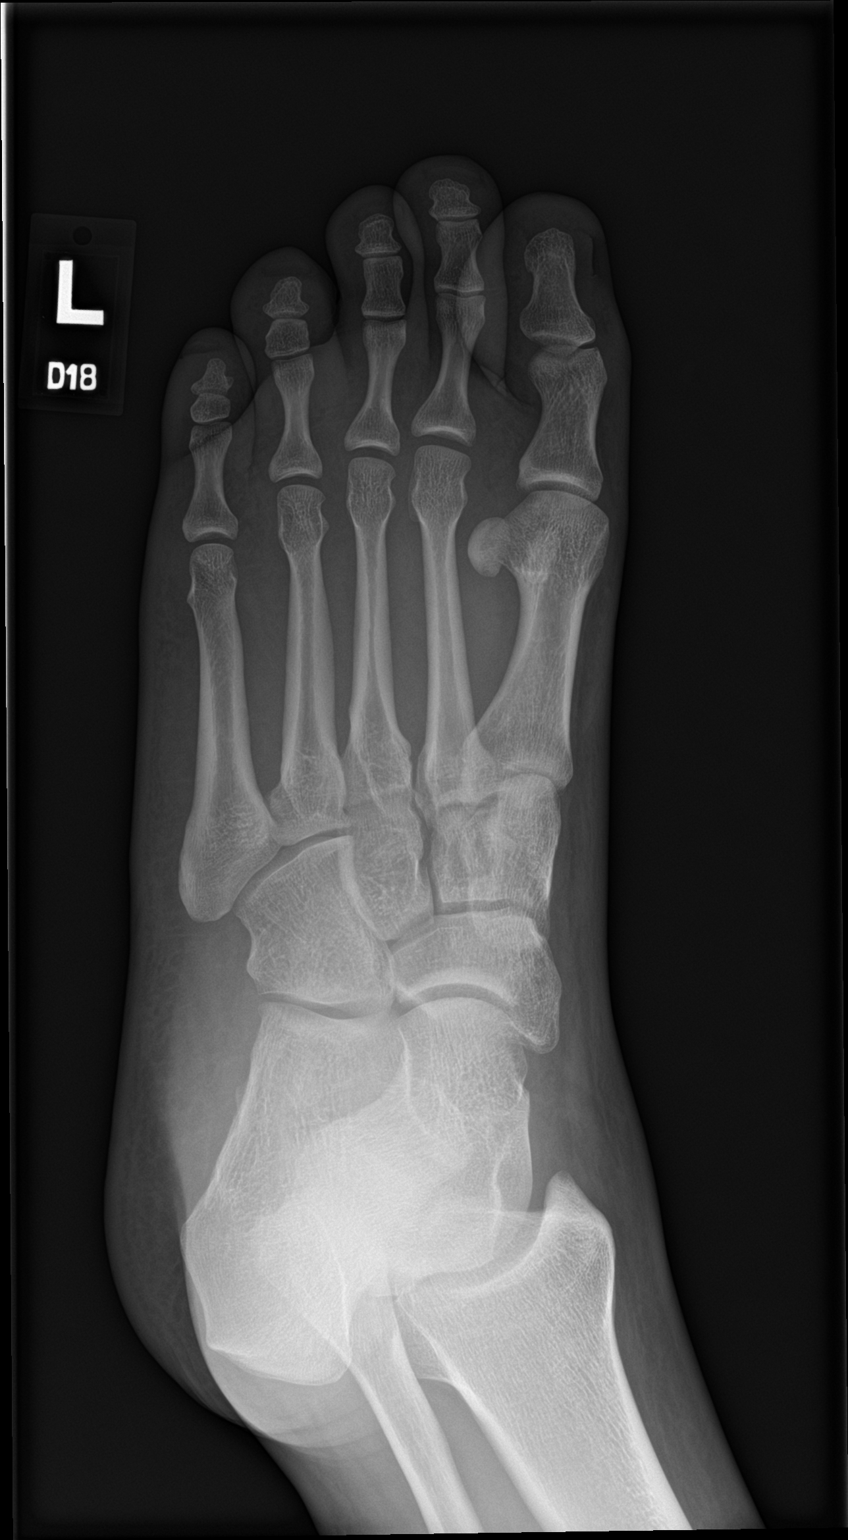

[foot lat]
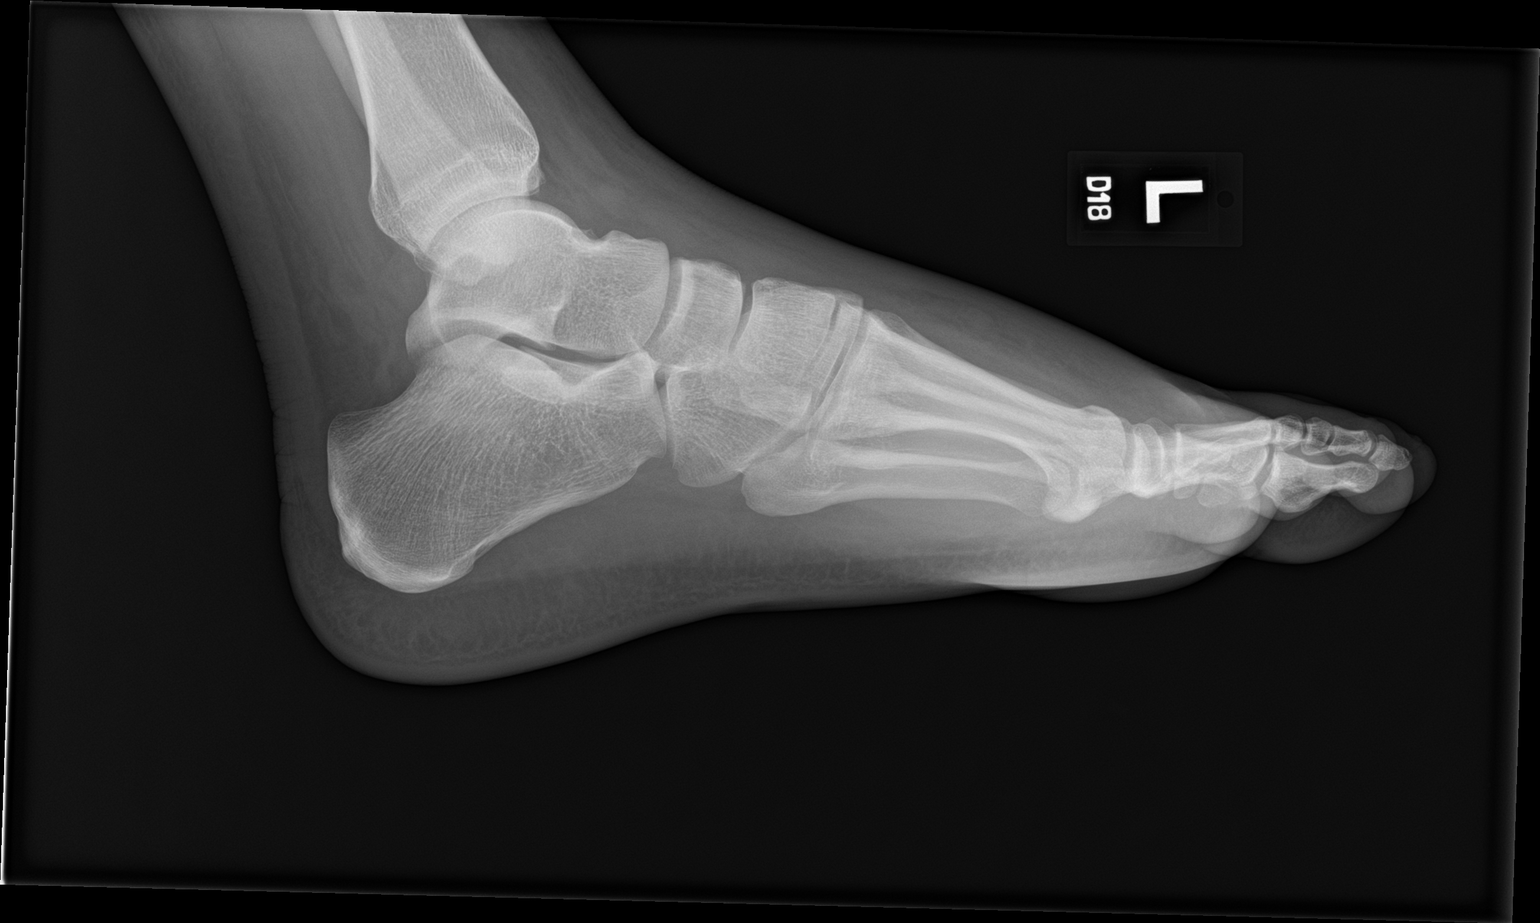

[3 of 3 positions shown; findings below may reference images not displayed]

FINDINGS: There is no evidence of fracture or dislocation. There is no
evidence of arthropathy or other focal bone abnormality. There is
stable mild to moderate severity soft tissue swelling along the
dorsal aspect of the mid to distal left foot.
IMPRESSION: 1. Stable mild to moderate severity dorsal soft tissue swelling
without evidence of acute osseous abnormality.
